# Patient Record
Sex: Female | Born: 2000 | Race: White | Hispanic: No | Marital: Married | Smoking: Never smoker
Health system: Southern US, Community
[De-identification: ages and names within clinical notes are randomized; demographics above are authoritative.]

## PROBLEM LIST (undated history)

## (undated) DIAGNOSIS — I4891 Unspecified atrial fibrillation: Secondary | ICD-10-CM

## (undated) DIAGNOSIS — I471 Supraventricular tachycardia, unspecified: Secondary | ICD-10-CM

## (undated) DIAGNOSIS — R51 Headache: Secondary | ICD-10-CM

## (undated) DIAGNOSIS — F419 Anxiety disorder, unspecified: Secondary | ICD-10-CM

## (undated) DIAGNOSIS — F32A Depression, unspecified: Secondary | ICD-10-CM

## (undated) DIAGNOSIS — G90A Postural orthostatic tachycardia syndrome (POTS): Secondary | ICD-10-CM

## (undated) DIAGNOSIS — F329 Major depressive disorder, single episode, unspecified: Secondary | ICD-10-CM

## (undated) DIAGNOSIS — K589 Irritable bowel syndrome without diarrhea: Secondary | ICD-10-CM

## (undated) HISTORY — PX: INCISION AND DRAINAGE: SHX5863

---
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
## 2020-07-22 HISTORY — PX: COLONOSCOPY WITH ESOPHAGOGASTRODUODENOSCOPY (EGD): SHX5779

## 2020-08-29 ENCOUNTER — Emergency Department (HOSPITAL_COMMUNITY)
Admission: EM | Admit: 2020-08-29 | Discharge: 2020-08-29 | Disposition: A | Discharge status: Home / Self Care | Payer: Medicaid Other | Attending: Emergency Medicine | Admitting: Emergency Medicine

## 2020-08-29 ENCOUNTER — Encounter (HOSPITAL_COMMUNITY): Payer: Self-pay | Admitting: *Deleted

## 2020-08-29 ENCOUNTER — Other Ambulatory Visit: Payer: Self-pay

## 2020-08-29 DIAGNOSIS — K0889 Other specified disorders of teeth and supporting structures: Secondary | ICD-10-CM | POA: Diagnosis not present

## 2020-08-29 DIAGNOSIS — U071 COVID-19: Secondary | ICD-10-CM | POA: Insufficient documentation

## 2020-08-29 DIAGNOSIS — Z7722 Contact with and (suspected) exposure to environmental tobacco smoke (acute) (chronic): Secondary | ICD-10-CM | POA: Diagnosis not present

## 2020-08-29 DIAGNOSIS — R197 Diarrhea, unspecified: Secondary | ICD-10-CM

## 2020-08-29 DIAGNOSIS — R112 Nausea with vomiting, unspecified: Secondary | ICD-10-CM | POA: Diagnosis present

## 2020-08-29 HISTORY — DX: Irritable bowel syndrome, unspecified: K58.9

## 2020-08-29 LAB — CBC WITH DIFFERENTIAL/PLATELET
Abs Immature Granulocytes: 0.04 10*3/uL (ref 0.00–0.07)
Basophils Absolute: 0.1 10*3/uL (ref 0.0–0.1)
Basophils Relative: 1 %
Eosinophils Absolute: 0 10*3/uL (ref 0.0–0.5)
Eosinophils Relative: 0 %
HCT: 44.7 % (ref 36.0–46.0)
Hemoglobin: 15.1 g/dL — ABNORMAL HIGH (ref 12.0–15.0)
Immature Granulocytes: 0 %
Lymphocytes Relative: 19 %
Lymphs Abs: 2.1 10*3/uL (ref 0.7–4.0)
MCH: 28.2 pg (ref 26.0–34.0)
MCHC: 33.8 g/dL (ref 30.0–36.0)
MCV: 83.4 fL (ref 80.0–100.0)
Monocytes Absolute: 0.4 10*3/uL (ref 0.1–1.0)
Monocytes Relative: 3 %
Neutro Abs: 8.5 10*3/uL — ABNORMAL HIGH (ref 1.7–7.7)
Neutrophils Relative %: 77 %
Platelets: 378 10*3/uL (ref 150–400)
RBC: 5.36 MIL/uL — ABNORMAL HIGH (ref 3.87–5.11)
RDW: 12.7 % (ref 11.5–15.5)
WBC: 11.1 10*3/uL — ABNORMAL HIGH (ref 4.0–10.5)
nRBC: 0 % (ref 0.0–0.2)

## 2020-08-29 LAB — BASIC METABOLIC PANEL
Anion gap: 10 (ref 5–15)
BUN: 17 mg/dL (ref 6–20)
CO2: 22 mmol/L (ref 22–32)
Calcium: 9.6 mg/dL (ref 8.9–10.3)
Chloride: 106 mmol/L (ref 98–111)
Creatinine, Ser: 0.8 mg/dL (ref 0.44–1.00)
GFR calc Af Amer: >60 mL/min (ref >60)
GFR calc non Af Amer: >60 mL/min (ref >60)
Glucose, Bld: 87 mg/dL (ref 70–99)
Potassium: 3.9 mmol/L (ref 3.5–5.1)
Sodium: 138 mmol/L (ref 135–145)

## 2020-08-29 MED ORDER — ACETAMINOPHEN 500 MG PO TABS
1000.0000 mg | ORAL_TABLET | Freq: Once | ORAL | Status: AC
  Administered 2020-08-29: 1000 mg via ORAL
  Filled 2020-08-29: qty 2

## 2020-08-29 MED ORDER — PENICILLIN V POTASSIUM 500 MG PO TABS: 500.0000 mg | ORAL_TABLET | Freq: Four times a day (QID) | ORAL | 0 refills | Status: AC

## 2020-08-29 MED ORDER — ONDANSETRON HCL 4 MG PO TABS: 4.0000 mg | ORAL_TABLET | Freq: Three times a day (TID) | ORAL | 0 refills | Status: DC | PRN

## 2020-08-29 MED ORDER — SODIUM CHLORIDE 0.9 % IV BOLUS
500.0000 mL | Freq: Once | INTRAVENOUS | Status: AC
  Administered 2020-08-29: 500 mL via INTRAVENOUS

## 2020-08-29 NOTE — ED Provider Notes (Signed)
[facility]-EMERGENCY DEPT Provider Note   CSN: [id number] Arrival date & time: 08/29/20  1048     History Chief Complaint  Patient presents with  . Covid Positive  . Emesis  . Dizziness    Dawn Mcpherson is a 19 y.o. female.  HPI   Patient is an 19 year old female with a medical history as noted below.  Patient states that about 8 days ago she began experiencing a multitude of symptoms including intermittent dry cough, fevers, chills, body aches, fatigue, intermittent diffuse headaches.  She states that she tested positive for COVID-19 the same day, which was August 31.  She initially was experiencing sore throat as well but states this alleviated.  Also notes associated congestion.  Yesterday she was increasingly nauseated and had about 8 episodes of vomiting.  Nonbilious nonbloody.  Mild lightheadedness without dizziness or syncope. Additionally reports watery brown diarrhea.  She has a history of IBS and denies her diarrhea has acutely worsened. No hematemesis or hematochezia.  At the same time she began to notice moderate pain in the left upper mouth.  Notes a history of dental caries in the region.  She went to her pediatrician earlier today who felt that she was dehydrated and warranted further evaluation in the emergency department.  No difficulty swallowing, drooling, chest pain, shortness of breath, abdominal pain, constipation, syncope.  She has not been vaccinated for COVID-19.     Past Medical History:  Diagnosis Date  . Anxiety   . Depression   . Headache(784.0)   . IBS (irritable bowel syndrome)     Patient Active Problem List   Diagnosis Date Noted  . MDD (major depressive disorder), single episode, severe , no psychosis (HCC) 09/20/2014  . Generalized anxiety disorder 09/20/2014    Past Surgical History:  Procedure Laterality Date  . INCISION AND DRAINAGE       OB History   No obstetric history on file.     No family history on  file.  Social History   Tobacco Use  . Smoking status: Passive Smoke Exposure - Never Smoker  . Smokeless tobacco: Never Used  Substance Use Topics  . Alcohol use: No  . Drug use: No    Home Medications Prior to Admission medications   Medication Sig Start Date End Date Taking? Authorizing Provider  amphetamine-dextroamphetamine (ADDERALL) 15 MG tablet Take 15 mg by mouth daily.    [provider]  ibuprofen (ADVIL,MOTRIN) 400 MG tablet Take 1 tablet (400 mg total) by mouth every 6 (six) hours as needed. 04/06/16   Triplett, Tammy, PA-C  ondansetron (ZOFRAN ODT) 4 MG disintegrating tablet Take 1 tablet (4 mg total) by mouth every 6 (six) hours as needed for nausea or vomiting. 08/30/17   Lowanda Foster, NP  QUEtiapine (SEROQUEL XR) 50 MG TB24 24 hr tablet Take 50 mg by mouth at bedtime.    [provider]    Allergies    Patient has no known allergies.  Review of Systems   Review of Systems  All other systems reviewed and are negative. Ten systems reviewed and are negative for acute change, except as noted in the HPI.   Physical Exam Updated Vital Signs BP (!) 118/94 (BP Location: Left Arm)   Pulse 96   Temp (!) 100.4 F (38 C) (Oral)   Resp 18   Ht 5\' 3"  (1.6 m)   Wt 94.3 kg   SpO2 96%   BMI 36.85 kg/m   Physical Exam  Vitals and nursing note reviewed.  Constitutional:      General: She is not in acute distress.    Appearance: Normal appearance. She is not ill-appearing, toxic-appearing or diaphoretic.  HENT:     Head: Normocephalic and atraumatic.     Right Ear: External ear normal.     Left Ear: External ear normal.     Nose: Nose normal.     Mouth/Throat:     Mouth: Mucous membranes are moist.     Pharynx: Oropharynx is clear. No oropharyngeal exudate or posterior oropharyngeal erythema.     Comments: Moderate TTP noted with manipulation of the left upper second molar.  No obvious dental caries.  No visible abscess or palpable fluctuance.   Generally normal-appearing dentition.  Uvula midline.  Patient readily handling secretions.  No evidence of PTA.  No hot potato voice. Eyes:     General: No scleral icterus.       Right eye: No discharge.        Left eye: No discharge.     Extraocular Movements: Extraocular movements intact.     Conjunctiva/sclera: Conjunctivae normal.  Cardiovascular:     Rate and Rhythm: Normal rate and regular rhythm.     Pulses: Normal pulses.     Heart sounds: Normal heart sounds. No murmur heard.  No friction rub. No gallop.      Comments: Heart is regular rate and rhythm.  Not tachycardic. Pulmonary:     Effort: Pulmonary effort is normal. No respiratory distress.     Breath sounds: Normal breath sounds. No stridor. No wheezing, rhonchi or rales.     Comments: Lungs are clear to auscultation bilaterally. Abdominal:     General: Abdomen is flat.     Palpations: Abdomen is soft.     Tenderness: There is no abdominal tenderness.  Musculoskeletal:        General: Normal range of motion.     Cervical back: Normal range of motion and neck supple. No tenderness.  Skin:    General: Skin is warm and dry.  Neurological:     General: No focal deficit present.     Mental Status: She is alert and oriented to person, place, and time.  Psychiatric:        Mood and Affect: Mood normal.        Behavior: Behavior normal.    ED Results / Procedures / Treatments   Labs (all labs ordered are listed, but only abnormal results are displayed) Labs Reviewed  CBC WITH DIFFERENTIAL/PLATELET - Abnormal; Notable for the following components:      Result Value   WBC 11.1 (*)    RBC 5.36 (*)    Hemoglobin 15.1 (*)    Neutro Abs 8.5 (*)    All other components within normal limits  BASIC METABOLIC PANEL   EKG None  Radiology No results found.  Procedures Procedures (including critical care time)  Medications Ordered in ED Medications  sodium chloride 0.9 % bolus 500 mL (0 mLs Intravenous Stopped  08/29/20 1239)  acetaminophen (TYLENOL) tablet 1,000 mg (1,000 mg Oral Given 08/29/20 1212)  sodium chloride 0.9 % bolus 500 mL (0 mLs Intravenous Stopped 08/29/20 1445)    ED Course  I have reviewed the triage vital signs and the nursing notes.  Pertinent labs & imaging results that were available during my care of the patient were reviewed by me and considered in my medical decision making (see chart for details).  Clinical Course as of  Aug 29 1424  Wed Aug 29, 2020  1149 Given ibuprofen PTA. Will give additional APAP.   Temp(!): 100.4 F (38 C) [LJ]  1235 Likely 2/2 hemoconcentration   Hemoglobin(!): 15.1 [LJ]  1236 Benign. No anion gap.   Basic metabolic panel [LJ]  1236 Mild leukocytosis and neutrophilia.   CBC with Differential(!) [LJ]  1252 Pt states she is still experiencing a mild HA. Labs concerning for hemoconcentration. Will give additional IVF.   [LJ]  1415 Pt reassessed and states she is feeling much better and notes moderate resolution of her HA.  Discussed patient being discharged at this time and she is amenable.  We will plan on providing a prescription for penicillin for her to take.  ECG is reassuring.  We will also provide a short prescription for Zofran for breakthrough nausea.  We discussed patient properly staying hydrated.  She understands that if her symptoms worsen or if she begins to once again develop intractable nausea and vomiting, she needs to return to the emergency department for reevaluation.  Her questions were answered and she was amicable at the time of discharge.  Her vital signs are stable.  She is not hypoxic.  She is not tachycardic.  Normotensive.   [LJ]    Clinical Course User Index [LJ] Placido SouJoldersma, Eleanna Theilen, PA-C   MDM Rules/Calculators/A&P                          Pt is a 19 y.o. female that presents with a history, physical exam, and ED Clinical Course as noted above.   Patient presents today with a multitude of symptoms consistent with COVID-19  as well as new onset dental pain.  I obtain basic labs on the patient.  Very mild leukocytosis and neutrophilia.  Patient had a mild fever of 100.4 Fahrenheit upon arrival.  She was given APAP in addition to the ibuprofen she was given just prior to arrival by her pediatrician.  She is normotensive and has not been hypoxic throughout her stay today.  Not tachypneic.  Patient noted having a significant headache when she arrived.  After APAP/ibuprofen as well as 1 L of IV fluids she notes moderate resolution of her symptoms.  I obtained an ECG as well which is reassuring.  Will discharge patient on a short course of Zofran.  We will additionally discharge patient on a course of penicillin for her dental pain.  She understands she needs to return to the emergency department if her symptoms worsen once again or if she develops intractable nausea and vomiting.  Patient is hemodynamically stable and in NAD at the time of d/c. Evaluation does not show pathology that would require ongoing emergent intervention or inpatient treatment. I explained the diagnosis to the patient. Patient is comfortable with above plan and is stable for discharge at this time. All questions were answered prior to disposition. Strict return precautions for returning to the ED were discussed. Encouraged follow up with PCP.    An After Visit Summary was printed and given to the patient.  Patient discharged to home/self care.  Condition at discharge: Stable  Note: Portions of this report may have been transcribed using voice recognition software. Every effort was made to ensure accuracy; however, inadvertent computerized transcription errors may be present.   Final Clinical Impression(s) / ED Diagnoses Final diagnoses:  Nausea vomiting and diarrhea  COVID-19  Pain, dental   Rx / DC Orders ED Discharge Orders  Ordered    penicillin v potassium (VEETID) 500 MG tablet  4 times daily        08/29/20 1421            Placido Sou, PA-C 08/29/20 1515    Alvira Monday, MD 08/29/20 1642

## 2020-08-29 NOTE — ED Triage Notes (Addendum)
Tested + for Covid Aug 31. Vomiting started yesterday, also Pediatrician wants someone to look at her tooth, top back left.  Pt later adds she has anxiety and is dizzy  Fever this morning 102.2 at [facility] office Tylenol given

## 2020-08-29 NOTE — Discharge Instructions (Signed)
I have prescribed penicillin for your tooth pain.  You are going to take this 4 times a day for the next 7 days.  Please do not stop taking this early.  I have also prescribed you Zofran for worsening nausea.  Only take this if you feel that you cannot eat/drink due to nausea and vomiting.  If your symptoms worsen once again you need to return to the emergency department for reevaluation.  It was a pleasure to meet you.

## 2021-03-06 ENCOUNTER — Other Ambulatory Visit: Payer: Self-pay

## 2021-03-06 ENCOUNTER — Ambulatory Visit: Payer: Medicaid Other | Admitting: Internal Medicine

## 2021-03-06 ENCOUNTER — Ambulatory Visit (INDEPENDENT_AMBULATORY_CARE_PROVIDER_SITE_OTHER): Payer: PRIVATE HEALTH INSURANCE | Admitting: Nurse Practitioner

## 2021-03-06 DIAGNOSIS — F322 Major depressive disorder, single episode, severe without psychotic features: Secondary | ICD-10-CM | POA: Diagnosis not present

## 2021-03-06 DIAGNOSIS — F909 Attention-deficit hyperactivity disorder, unspecified type: Secondary | ICD-10-CM | POA: Diagnosis not present

## 2021-03-06 DIAGNOSIS — F411 Generalized anxiety disorder: Secondary | ICD-10-CM | POA: Diagnosis not present

## 2021-03-06 DIAGNOSIS — R635 Abnormal weight gain: Secondary | ICD-10-CM

## 2021-03-06 DIAGNOSIS — Z7689 Persons encountering health services in other specified circumstances: Secondary | ICD-10-CM | POA: Diagnosis not present

## 2021-03-06 DIAGNOSIS — L732 Hidradenitis suppurativa: Secondary | ICD-10-CM | POA: Insufficient documentation

## 2021-03-06 MED ORDER — BUPROPION HCL ER (XL) 150 MG PO TB24: 150.0000 mg | ORAL_TABLET | Freq: Every day | ORAL | 0 refills | Status: DC

## 2021-03-06 MED ORDER — DOXYCYCLINE HYCLATE 100 MG PO TABS: 100.0000 mg | ORAL_TABLET | Freq: Two times a day (BID) | ORAL | 0 refills | Status: DC

## 2021-03-06 NOTE — Assessment & Plan Note (Signed)
-  will check thyroid panel with next set of labs 

## 2021-03-06 NOTE — Assessment & Plan Note (Signed)
-  obtain records 

## 2021-03-06 NOTE — Assessment & Plan Note (Signed)
-  Rx. Doxycycline -discussed warm compressed -if no improvement with doxy, will consider derm or surgeon referral -she has had I&D in the past and seen derm as well

## 2021-03-06 NOTE — Patient Instructions (Addendum)
Please have fasting labs drawn 2-3 days prior to your appointment so we can discuss them at your next visit.  Notify me/the office immediately if you have any suicidal ideation after starting the wellbutrin.  I sent in a referral for psychiatry and therapy, so you should hear form them within the week. If you haven;t heard from them by the time your antibiotic (doxycycline) has finished, please notify the office so we can check on the status of the referral for you.

## 2021-03-06 NOTE — Assessment & Plan Note (Signed)
-  she would like medication to help with her concentration -referral to psychiatry

## 2021-03-06 NOTE — Assessment & Plan Note (Signed)
-  hx of MDD -has had worsening depression and anxiety lately -suicide attempt in the past, self-mutilated in the past -no SI, HI, or psychosis today -referral to psychiatry -referral to therapy

## 2021-03-06 NOTE — Progress Notes (Signed)
New Patient Office Visit  Subjective:  Patient ID: Dawn Mcpherson, female    DOB: 2001-07-10  Age: 20 y.o. MRN: [id number]  CC:  Chief Complaint  Patient presents with  . New Patient (Initial Visit)    HPI Dawn Mcpherson presents for new patient visit. Transferring care from [place] physical was about a year ago. Last labs 08/29/20.  She was taking adderrall around age 60, but states she didn't take it for long.  She states that she has been gaining weight lately.  She states has been having headaches for several years, and she gets them about once per day and they may last a few minutes to several hours. She states she has nausea with her headaches.  Past Medical History:  Diagnosis Date  . Anxiety   . Depression   . Depression    [employer]rees[employer] 03/06/2021  . Headache(784.0)   . IBS (irritable bowel syndrome)     Past Surgical History:  Procedure Laterality Date  . INCISION AND DRAINAGE      History reviewed. No pertinent family history.  Social History   Socioeconomic History  . Marital status: Single    Spouse name: Not on file  . Number of children: Not on file  . Years of education: Not on file  . Highest education level: Not on file  Occupational History  . Occupation: [employer]    Comment: Office manager  Tobacco Use  . Smoking status: Passive Smoke Exposure - Never Smoker  . Smokeless tobacco: Never Used  Vaping Use  . Vaping Use: Never used  Substance and Sexual Activity  . Alcohol use: No  . Drug use: No  . Sexual activity: Never  Other Topics Concern  . Not on file  Social History Narrative  . Not on file   Social Determinants of Health   Financial Resource Strain: Not on file  Food Insecurity: Not on file  Transportation Needs: Not on file  Physical Activity: Not on file  Stress: Not on file  Social Connections: Not on file  Intimate Partner Violence: Not on file    ROS Review of Systems  Constitutional:  Negative.   Respiratory: Negative.   Cardiovascular: Negative.   Skin: Positive for wound.       To right axilla  Psychiatric/Behavioral: Negative for self-injury and suicidal ideas. The patient is nervous/anxious and is hyperactive.        Has hx of SI and was admitted to [facility] for suicidal ideation in the past (2015-2016ish)    Objective:   Today's Vitals: BP 120/82   Pulse 97   Temp 98.2 F (36.8 C)   Resp 20   Ht 5' 3"  (1.6 m)   Wt 211 lb (95.7 kg)   SpO2 99%   BMI 37.38 kg/m   Physical Exam Constitutional:      Appearance: She is obese.  Cardiovascular:     Rate and Rhythm: Normal rate and regular rhythm.     Pulses: Normal pulses.     Heart sounds: Normal heart sounds.  Pulmonary:     Effort: Pulmonary effort is normal.     Breath sounds: Normal breath sounds.  Skin:    Comments: Wound to right axilla, scarring present from previous HS episodes  Neurological:     Mental Status: She is alert.  Psychiatric:        Behavior: Behavior normal.        Thought Content: Thought content normal.  Judgment: Judgment normal.     Comments: Anxious affect     Assessment & Plan:   Problem List Items Addressed This Visit      Musculoskeletal and Integument   Hidradenitis suppurativa of right axilla    -Rx. Doxycycline -discussed warm compressed -if no improvement with doxy, will consider derm or surgeon referral -she has had I&D in the past and seen derm as well        Other   Generalized anxiety disorder (Chronic)    -no current medications -GAD-7 = 21/21 today -Rx. Wellbutrin; will consider dose increase in a month as long as she has no adverse effects or SI -referral to psychiatrist and therapy      Relevant Medications   buPROPion (WELLBUTRIN XL) 150 MG 24 hr tablet   Other Relevant Orders   Ambulatory referral to Psychiatry   MDD (major depressive disorder), single episode, severe , no psychosis ([place])    -hx of MDD -has had worsening depression and  anxiety lately -suicide attempt in the past, self-mutilated in the past -no SI, HI, or psychosis today -referral to psychiatry -referral to therapy      Relevant Medications   buPROPion (WELLBUTRIN XL) 150 MG 24 hr tablet   Other Relevant Orders   Ambulatory referral to Psychiatry   Encounter to establish care    -obtain records      Relevant Orders   CBC with Differential/Platelet   CMP14+EGFR   Lipid Panel With LDL/HDL Ratio   TSH + free T4   ADHD    -she would like medication to help with her concentration -referral to psychiatry      Relevant Orders   Ambulatory referral to Psychiatry   Weight gain, abnormal    -will check thyroid panel with next set of labs         Outpatient Encounter Medications as of 03/06/2021  Medication Sig  . buPROPion (WELLBUTRIN XL) 150 MG 24 hr tablet Take 1 tablet (150 mg total) by mouth daily.  Marland Kitchen doxycycline (VIBRA-TABS) 100 MG tablet Take 1 tablet (100 mg total) by mouth 2 (two) times daily.  . [DISCONTINUED] amphetamine-dextroamphetamine (ADDERALL) 15 MG tablet Take 15 mg by mouth daily. (Patient not taking: Reported on 03/06/2021)  . [DISCONTINUED] ibuprofen (ADVIL,MOTRIN) 400 MG tablet Take 1 tablet (400 mg total) by mouth every 6 (six) hours as needed. (Patient not taking: Reported on 03/06/2021)  . [DISCONTINUED] ondansetron (ZOFRAN ODT) 4 MG disintegrating tablet Take 1 tablet (4 mg total) by mouth every 6 (six) hours as needed for nausea or vomiting. (Patient not taking: Reported on 03/06/2021)  . [DISCONTINUED] ondansetron (ZOFRAN) 4 MG tablet Take 1 tablet (4 mg total) by mouth every 8 (eight) hours as needed for nausea or vomiting. (Patient not taking: Reported on 03/06/2021)  . [DISCONTINUED] QUEtiapine (SEROQUEL XR) 50 MG TB24 24 hr tablet Take 50 mg by mouth at bedtime. (Patient not taking: Reported on 03/06/2021)   No facility-administered encounter medications on file as of 03/06/2021.    Follow-up: Return in about 1 month  (around 04/06/2021) for Physical Exam.   Noreene Larsson, NP

## 2021-03-06 NOTE — Assessment & Plan Note (Addendum)
-  no current medications -GAD-7 = 21/21 today -Rx. Wellbutrin; will consider dose increase in a month as long as she has no adverse effects or SI -referral to psychiatrist and therapy

## 2021-03-08 ENCOUNTER — Other Ambulatory Visit: Payer: Self-pay

## 2021-03-08 DIAGNOSIS — F411 Generalized anxiety disorder: Secondary | ICD-10-CM

## 2021-03-24 ENCOUNTER — Telehealth: Payer: PRIVATE HEALTH INSURANCE | Admitting: Physician Assistant

## 2021-03-24 ENCOUNTER — Encounter: Payer: Self-pay | Admitting: Physician Assistant

## 2021-03-24 DIAGNOSIS — J029 Acute pharyngitis, unspecified: Secondary | ICD-10-CM

## 2021-03-24 DIAGNOSIS — R112 Nausea with vomiting, unspecified: Secondary | ICD-10-CM

## 2021-03-24 DIAGNOSIS — J069 Acute upper respiratory infection, unspecified: Secondary | ICD-10-CM | POA: Diagnosis not present

## 2021-03-24 MED ORDER — ONDANSETRON 4 MG PO TBDP: ORAL_TABLET | ORAL | 0 refills | Status: DC

## 2021-03-24 MED ORDER — AMOXICILLIN-POT CLAVULANATE 875-125 MG PO TABS: 1.0000 | ORAL_TABLET | Freq: Two times a day (BID) | ORAL | 0 refills | Status: DC

## 2021-03-24 MED ORDER — FLUTICASONE PROPIONATE 50 MCG/ACT NA SUSP: 2.0000 | Freq: Every day | NASAL | 0 refills | Status: DC

## 2021-03-24 NOTE — Progress Notes (Signed)
Dawn Mcpherson, Dawn Mcpherson are scheduled for a virtual visit with your provider today.    Just as we do with appointments in the office, we must obtain your consent to participate.  Your consent will be active for this visit and any virtual visit you may have with one of our providers in the next 365 days.    If you have a MyChart account, I can also send a copy of this consent to you electronically.  All virtual visits are billed to your insurance company just like a traditional visit in the office.  As this is a virtual visit, video technology does not allow for your provider to perform a traditional examination.  This may limit your provider's ability to fully assess your condition.  If your provider identifies any concerns that need to be evaluated in person or the need to arrange testing such as labs, EKG, etc, we will make arrangements to do so.    Although advances in technology are sophisticated, we cannot ensure that it will always work on either your end or our end.  If the connection with a video visit is poor, we may have to switch to a telephone visit.  With either a video or telephone visit, we are not always able to ensure that we have a secure connection.   I need to obtain your verbal consent now.   Are you willing to proceed with your visit today?   [name] has provided verbal consent on 03/24/2021 for a virtual visit (video or telephone).   Dierdre Forth, PA-C 03/24/2021  3:53 PM   Date:  03/24/2021   ID:  [name], DOB [birth year], MRN [id number]  Patient Location: Home Provider Location: Home Office   Participants: Patient and Provider for Visit and Wrap up  Method of visit: Video  Location of Patient: Home Location of Provider: Home Office Consent was obtain for visit over the video. Services rendered by provider: Visit was performed via video  A video enabled telemedicine application was used and I verified that I am speaking with the correct person using two  identifiers.  PCP:  Heather Roberts, NP   Chief Complaint:  Sore throat  History of Present Illness:    Dawn Mcpherson is a 20 y.o. female with history as stated below. Presents video telehealth for an acute care visit  Onset of symptoms was yesterday and symptoms have been persistent and include: fever to 102, sore throat, cough, rhinorrhea, nasal congestion, nausea, vomiting x2 (NBNB). Pt reports her co-worker is sick with similar symptoms. Pt reports symptoms are similar to previous episodes of strep throat.  Pt is not vaccinated for COVID, did have COVID in October 2020.    Denies having shortness of breath, chest pain, diarrhea, urinary symptoms, vaginal symptoms.  Modifying factors include: Tylenol and motrin which helped with the fever  No other aggravating or relieving factors.  No other c/o.  Pt denies possibility of pregnancy.  The patient does have symptoms concerning for COVID-19 infection (fever, chills, cough, or new shortness of breath).  Patient has not been tested for COVID during this illness.  Past Medical, Surgical, Social History, Allergies, and Medications have been Reviewed.  Patient Active Problem List   Diagnosis Date Noted  . Encounter to establish care 03/06/2021  . ADHD 03/06/2021  . Weight gain, abnormal 03/06/2021  . Hidradenitis suppurativa of right axilla 03/06/2021  . MDD (major depressive disorder), single episode, severe , no psychosis (HCC) 09/20/2014  .  Generalized anxiety disorder 09/20/2014    Social History   Tobacco Use  . Smoking status: Passive Smoke Exposure - Never Smoker  . Smokeless tobacco: Never Used  Substance Use Topics  . Alcohol use: No     Current Outpatient Medications:  .  amoxicillin-clavulanate (AUGMENTIN) 875-125 MG tablet, Take 1 tablet by mouth 2 (two) times daily. One po bid x 7 days, Disp: 14 tablet, Rfl: 0 .  fluticasone (FLONASE) 50 MCG/ACT nasal spray, Place 2 sprays into both nostrils daily., Disp: 9.9  g, Rfl: 0 .  ondansetron (ZOFRAN ODT) 4 MG disintegrating tablet, 4mg  ODT q4 hours prn nausea/vomit, Disp: 10 tablet, Rfl: 0 .  buPROPion (WELLBUTRIN XL) 150 MG 24 hr tablet, Take 1 tablet (150 mg total) by mouth daily., Disp: 30 tablet, Rfl: 0   No Known Allergies   Review of Systems  Constitutional: Positive for chills, fever and malaise/fatigue.  HENT: Positive for congestion, ear pain and sore throat.   Eyes: Negative for blurred vision and double vision.  Respiratory: Positive for cough. Negative for shortness of breath and wheezing.   Cardiovascular: Negative for chest pain, palpitations and leg swelling.  Gastrointestinal: Positive for nausea and vomiting. Negative for abdominal pain and diarrhea.  Genitourinary: Negative for dysuria.  Musculoskeletal: Negative for myalgias.  Skin: Negative for rash.  Neurological: Negative for loss of consciousness, weakness and headaches.  Psychiatric/Behavioral: The patient is not nervous/anxious.    See HPI for history of present illness.  Physical Exam Constitutional:      General: She is not in acute distress.    Appearance: Normal appearance. She is not ill-appearing.  HENT:     Head: Normocephalic and atraumatic.     Nose: Congestion and rhinorrhea present.     Mouth/Throat:     Mouth: Mucous membranes are moist.     Comments: Posterior oropharynx is red without exudate Eyes:     Extraocular Movements: Extraocular movements intact.  Pulmonary:     Effort: Pulmonary effort is normal.     Comments: Speaking in full sentences Dry cough Musculoskeletal:        General: Normal range of motion.     Cervical back: Normal range of motion.  Skin:    Coloration: Skin is not pale.  Neurological:     General: No focal deficit present.     Mental Status: She is alert. Mental status is at baseline.  Psychiatric:        Mood and Affect: Mood normal.               A&P  1. Upper respiratory tract infection, unspecified type  -  flonase  - OTC cough medication as needed - Robitussin or Delsym  - fever control with tylenol and ibuprofen  - symptoms concerning for URI vs COVID  2. Sore throat  - likely viral given other URI symptoms  - pt reports symptoms similar to previous episode of strep - will give Augmentin  3. Non-intractable vomiting with nausea, unspecified vomiting type  - 2 episodes of emesis, holding fluids at this time  - zofran as needed for nausea and vomiting   Patient voiced understanding and agreement to plan.   Time:   Today, I have spent 15 minutes with the patient with telehealth technology discussing the above problems, reviewing the chart, previous notes, medications and orders.    Tests Ordered: No orders of the defined types were placed in this encounter.   Medication Changes: Meds ordered this encounter  Medications  . amoxicillin-clavulanate (AUGMENTIN) 875-125 MG tablet    Sig: Take 1 tablet by mouth 2 (two) times daily. One po bid x 7 days    Dispense:  14 tablet    Refill:  0  . ondansetron (ZOFRAN ODT) 4 MG disintegrating tablet    Sig: 4mg  ODT q4 hours prn nausea/vomit    Dispense:  10 tablet    Refill:  0  . fluticasone (FLONASE) 50 MCG/ACT nasal spray    Sig: Place 2 sprays into both nostrils daily.    Dispense:  9.9 g    Refill:  0     Disposition:  Follow up PCP or urgent care if no improvement in 24-48 hours.  11-09-1980, PA-C  03/24/2021 4:09 PM

## 2021-03-24 NOTE — Patient Instructions (Addendum)
1. Upper respiratory tract infection, unspecified type  - flonase  - OTC cough medication as needed - Robitussin or Delsym  - fever control with tylenol and ibuprofen  - symptoms concerning for URI vs COVID  2. Sore throat  - likely viral given other URI symptoms  - pt reports symptoms similar to previous episode of strep - will give Augmentin  3. Non-intractable vomiting with nausea, unspecified vomiting type  - 2 episodes of emesis, holding fluids at this time  - zofran as needed for nausea and vomiting Nausea and Vomiting, Adult Nausea is feeling sick to your stomach or feeling that you are about to throw up (vomit). Vomiting is when food in your stomach is thrown up and out of the mouth. Throwing up can make you feel weak. It can also make you lose too much water in your body (get dehydrated). If you lose too much water in your body, you may:  Feel tired.  Feel thirsty.  Have a dry mouth.  Have cracked lips.  Go pee (urinate) less often. Older adults and people with other diseases or a weak body defense system (immune system) are at higher risk for losing too much water in the body. If you feel sick to your stomach and you throw up, it is important to follow instructions from your doctor about how to take care of yourself. Follow these instructions at home: Watch your symptoms for any changes. Tell your doctor about them. Follow these instructions to care for yourself at home. Eating and drinking  Take an ORS (oral rehydration solution). This is a drink that is sold at pharmacies and stores.  Drink clear fluids in small amounts as you are able, such as: ? Water. ? Ice chips. ? Fruit juice that has water added (diluted fruit juice). ? Low-calorie sports drinks.  Eat bland, easy-to-digest foods in small amounts as you are able, such as: ? Bananas. ? Applesauce. ? Rice. ? Low-fat (lean) meats. ? Toast. ? Crackers.  Avoid drinking fluids that have a lot of sugar or  caffeine in them. This includes energy drinks, sports drinks, and soda.  Avoid alcohol.  Avoid spicy or fatty foods.      General instructions  Take over-the-counter and prescription medicines only as told by your doctor.  Drink enough fluid to keep your pee (urine) pale yellow.  Wash your hands often with soap and water. If you cannot use soap and water, use hand sanitizer.  Make sure that all people in your home wash their hands well and often.  Rest at home while you get better.  Watch your condition for any changes.  Take slow and deep breaths when you feel sick to your stomach.  Keep all follow-up visits as told by your doctor. This is important. Contact a doctor if:  Your symptoms get worse.  You have new symptoms.  You have a fever.  You cannot drink fluids without throwing up.  You feel sick to your stomach for more than 2 days.  You feel light-headed or dizzy.  You have a headache.  You have muscle cramps.  You have a rash.  You have pain while peeing. Get help right away if:  You have pain in your chest, neck, arm, or jaw.  You feel very weak or you pass out (faint).  You throw up again and again.  You have throw up that is bright red or looks like black coffee grounds.  You have bloody or black poop (  stools) or poop that looks like tar.  You have a very bad headache, a stiff neck, or both.  You have very bad pain, cramping, or bloating in your belly (abdomen).  You have trouble breathing.  You are breathing very quickly.  Your heart is beating very quickly.  Your skin feels cold and clammy.  You feel confused.  You have signs of losing too much water in your body, such as: ? Dark pee, very little pee, or no pee. ? Cracked lips. ? Dry mouth. ? Sunken eyes. ? Sleepiness. ? Weakness. These symptoms may be an emergency. Do not wait to see if the symptoms will go away. Get medical help right away. Call your local emergency services  (911 in the [place] Do not drive yourself to the hospital. Summary  Nausea is feeling sick to your stomach or feeling that you are about to throw up (vomit). Vomiting is when food in your stomach is thrown up and out of the mouth.  Follow instructions from your doctor about eating and drinking to keep from losing too much water in your body.  Take over-the-counter and prescription medicines only as told by your doctor.  Contact your doctor if your symptoms get worse or you have new symptoms.  Keep all follow-up visits as told by your doctor. This is important. This information is not intended to replace advice given to you by your health care provider. Make sure you discuss any questions you have with your health care provider. Document Revised: 04/01/2019 Document Reviewed: 05/18/2018 Elsevier Patient Education  2021 [employer] Inc.  Pharyngitis  Pharyngitis is a sore throat (pharynx). This is when there is redness, pain, and swelling in your throat. Most of the time, this condition gets better on its own. In some cases, you may need medicine. Follow these instructions at home:  Take over-the-counter and prescription medicines only as told by your doctor. ? If you were prescribed an antibiotic medicine, take it as told by your doctor. Do not stop taking the antibiotic even if you start to feel better. ? Do not give children aspirin. Aspirin has been linked to Reye syndrome.  Drink enough water and fluids to keep your pee (urine) clear or pale yellow.  Get a lot of rest.  Rinse your mouth (gargle) with a salt-water mixture 3-4 times a day or as needed. To make a salt-water mixture, completely dissolve -1 tsp of salt in 1 cup of warm water.  If your doctor approves, you may use throat lozenges or sprays to soothe your throat. Contact a doctor if:  You have large, tender lumps in your neck.  You have a rash.  You cough up green, yellow-brown, or bloody spit. Get help right away  if:  You have a stiff neck.  You drool or cannot swallow liquids.  You cannot drink or take medicines without throwing up.  You have very bad pain that does not go away with medicine.  You have problems breathing, and it is not from a stuffy nose.  You have new pain and swelling in your knees, ankles, wrists, or elbows. Summary  Pharyngitis is a sore throat (pharynx). This is when there is redness, pain, and swelling in your throat.  If you were prescribed an antibiotic medicine, take it as told by your doctor. Do not stop taking the antibiotic even if you start to feel better.  Most of the time, pharyngitis gets better on its own. Sometimes, you may need medicine. This information  is not intended to replace advice given to you by your health care provider. Make sure you discuss any questions you have with your health care provider. Document Revised: 11/20/2017 Document Reviewed: 01/13/2017 Elsevier Patient Education  2021 Elsevier Inc.  Viral Respiratory Infection A viral respiratory infection is an illness that affects parts of the body that are used for breathing. These include the lungs, nose, and throat. It is caused by a germ called a virus. Some examples of this kind of infection are:  A cold.  The flu (influenza).  A respiratory syncytial virus (RSV) infection. A person who gets this illness may have the following symptoms:  A stuffy or runny nose.  Yellow or green fluid in the nose.  A cough.  Sneezing.  Tiredness (fatigue).  Achy muscles.  A sore throat.  Sweating or chills.  A fever.  A headache. Follow these instructions at home: Managing pain and congestion  Take over-the-counter and prescription medicines only as told by your doctor.  If you have a sore throat, gargle with salt water. Do this 3-4 times per day or as needed. To make a salt-water mixture, dissolve -1 tsp of salt in 1 cup of warm water. Make sure that all the salt  dissolves.  Use nose drops made from salt water. This helps with stuffiness (congestion). It also helps soften the skin around your nose.  Drink enough fluid to keep your pee (urine) pale yellow. General instructions  Rest as much as possible.  Do not drink alcohol.  Do not use any products that have nicotine or tobacco, such as cigarettes and e-cigarettes. If you need help quitting, ask your doctor.  Keep all follow-up visits as told by your doctor. This is important.   How is this prevented?  Get a flu shot every year. Ask your doctor when you should get your flu shot.  Do not let other people get your germs. If you are sick: ? Stay home from work or school. ? Wash your hands with soap and water often. Wash your hands after you cough or sneeze. If soap and water are not available, use hand sanitizer.  Avoid contact with people who are sick during cold and flu season. This is in fall and winter.   Get help if:  Your symptoms last for 10 days or longer.  Your symptoms get worse over time.  You have a fever.  You have very bad pain in your face or forehead.  Parts of your jaw or neck become very swollen. Get help right away if:  You feel pain or pressure in your chest.  You have shortness of breath.  You faint or feel like you will faint.  You keep throwing up (vomiting).  You feel confused. Summary  A viral respiratory infection is an illness that affects parts of the body that are used for breathing.  Examples of this illness include a cold, the flu, and respiratory syncytial virus (RSV) infection.  The infection can cause a runny nose, cough, sneezing, sore throat, and fever.  Follow what your doctor tells you about taking medicines, drinking lots of fluid, washing your hands, resting at home, and avoiding people who are sick. This information is not intended to replace advice given to you by your health care provider. Make sure you discuss any questions you  have with your health care provider. Document Revised: 12/16/2018 Document Reviewed: 01/18/2018 Elsevier Patient Education  2021 [employer].

## 2021-03-29 ENCOUNTER — Encounter: Payer: PRIVATE HEALTH INSURANCE | Admitting: Nurse Practitioner

## 2021-04-04 ENCOUNTER — Ambulatory Visit: Payer: PRIVATE HEALTH INSURANCE | Admitting: Psychology

## 2021-04-08 ENCOUNTER — Other Ambulatory Visit: Payer: Self-pay | Admitting: Nurse Practitioner

## 2021-04-09 MED ORDER — BUPROPION HCL ER (XL) 150 MG PO TB24
150.0000 mg | ORAL_TABLET | Freq: Every day | ORAL | 0 refills | Status: DC
Start: 2021-04-09 — End: 2021-05-31

## 2021-04-19 ENCOUNTER — Encounter: Payer: PRIVATE HEALTH INSURANCE | Admitting: Nurse Practitioner

## 2021-04-23 ENCOUNTER — Ambulatory Visit: Payer: PRIVATE HEALTH INSURANCE

## 2021-04-23 ENCOUNTER — Telehealth (INDEPENDENT_AMBULATORY_CARE_PROVIDER_SITE_OTHER): Payer: PRIVATE HEALTH INSURANCE | Admitting: Licensed Clinical Social Worker

## 2021-04-23 ENCOUNTER — Other Ambulatory Visit: Payer: Self-pay

## 2021-04-23 DIAGNOSIS — F411 Generalized anxiety disorder: Secondary | ICD-10-CM

## 2021-04-23 DIAGNOSIS — F322 Major depressive disorder, single episode, severe without psychotic features: Secondary | ICD-10-CM

## 2021-04-23 NOTE — BH Specialist Note (Signed)
[facility] Initial Clinical Assessment  MRN: [id number] NAME: Dawn Mcpherson Date: 04/23/21  Start time:  1130a End time:  12p Total time:  30 min  Call number:  [phone]  Type of Contact:  telephone Patient consent obtained:  yes Reason for Visit today:  begin [facility] services  Treatment History Patient recently received Inpatient Treatment:  no  Facility/Program:    Date of discharge:   Patient currently being seen by therapist/psychiatrist:   Patient currently receiving the following services:    Past Psychiatric History/Hospitalization(s): Anxiety: Yes Bipolar Disorder: No Depression: Yes Mania: No Psychosis: No Schizophrenia: No Personality Disorder: No Hospitalization for psychiatric illness: No History of Electroconvulsive Shock Therapy: No Prior Suicide Attempts: No  Clinical Assessment:  PHQ-9 Assessments: Depression screen [facility] 2/9 04/23/2021 03/06/2021  Decreased Interest 2 0  Down, Depressed, Hopeless 2 0  PHQ - 2 Score 4 0  Altered sleeping 2 -  Tired, decreased energy 3 -  Change in appetite 3 -  Feeling bad or failure about yourself  2 -  Trouble concentrating 2 -  Moving slowly or fidgety/restless 2 -  Suicidal thoughts 0 -  PHQ-9 Score 18 -  Difficult doing work/chores Very difficult -    GAD-7 Assessments: GAD 7 : Generalized Anxiety Score 04/23/2021 03/06/2021  Nervous, Anxious, on Edge 3 3  Control/stop worrying 3 3  Worry too much - different things 3 3  Trouble relaxing 3 3  Restless 3 3  Easily annoyed or irritable 3 3  Afraid - awful might happen 3 3  Total GAD 7 Score 21 21  Anxiety Difficulty Somewhat difficult Very difficult     Social Functioning Social maturity:  WNL Social judgement:  WNL  Stress Current stressors:  grandfather in hospital; dog is sick Familial stressors:  father is disabled Sleep:  poor sleep pattersn Appetite:  only eats once; will vomit after eating Coping ability:  overwhelmed Patient  taking medications as prescribed:    Current medications:  Outpatient Encounter Medications as of 04/23/2021  Medication Sig  . amoxicillin-clavulanate (AUGMENTIN) 875-125 MG tablet Take 1 tablet by mouth 2 (two) times daily. One po bid x 7 days  . buPROPion (WELLBUTRIN XL) 150 MG 24 hr tablet Take 1 tablet (150 mg total) by mouth daily.  . fluticasone (FLONASE) 50 MCG/ACT nasal spray Place 2 sprays into both nostrils daily.  . ondansetron (ZOFRAN ODT) 4 MG disintegrating tablet 4mg  ODT q4 hours prn nausea/vomit   No facility-administered encounter medications on file as of 04/23/2021.    Self-harm Behaviors Risk Assessment Self-harm risk factors:   Patient endorses recent thoughts of harming self:    06/23/2021 Suicide Severity Rating Scale: No flowsheet data found.  Danger to Others Risk Assessment Danger to others risk factors:   Patient endorses recent thoughts of harming others:    Dynamic Appraisal of Situational Aggression (DASA): No flowsheet data found.  Substance Use Assessment Patient recently consumed alcohol:    Alcohol Use Disorder Identification Test (AUDIT):  Alcohol Use Disorder Test (AUDIT) 09/20/2014  1. How often do you have a drink containing alcohol? 0   Patient recently used drugs:    Opioid Risk Assessment:  Patient is concerned about dependence or abuse of substances:    ASAM Multidimensional Assessment Summary:  Dimension 1:    Dimension 1 Rating:    Dimension 2:    Dimension 2 Rating:    Dimension 3:    Dimension 3 Rating:    Dimension 4:  Dimension 4 Rating:    Dimension 5:    Dimension 5 Rating:    Dimension 6:    Dimension 6 Rating:   ASAM's Severity Rating Score:   ASAM Recommended Level of Treatment:     Goals, Interventions and Follow-up Plan Goals: Increase healthy adjustment to current life circumstances Interventions: Motivational Interviewing, Solution-Focused Strategies and Mindfulness or Relaxation Training Follow-up Plan: Weekly  [facility] services  Summary of Clinical Assessment Summary: Dawn Mcpherson is a 20 yr old woman who was referred by her PCP for depression and anxiety.  Patient has a history of depression and anxiety. She attended therapy for 1.5 yrs and received medication while in high school.  She discontinued medications "I was feeling and doing better, so I thought I had a handle on it." She reports poor eating; getting nauseous after eating; eats dinner only. She also reports poor sleep; having difficulty falling asleep.  She has a boyfriend for the past 5 years; reports good relationship with him and his family.  She has friends that she engages with weekly. Arita has 2 sisters that she has a good relationship with. Lives at home with parents; poor relationship with father but good connection with mother. Mother encourages therapy. Denies drugs, alcohol usage.  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  She was tearful throughout the interview. Will follow up; may consider for traditional therapy  Marinda Elk, LCSW

## 2021-04-24 ENCOUNTER — Telehealth: Payer: Self-pay | Admitting: Licensed Clinical Social Worker

## 2021-04-24 DIAGNOSIS — F322 Major depressive disorder, single episode, severe without psychotic features: Secondary | ICD-10-CM

## 2021-04-24 DIAGNOSIS — F411 Generalized anxiety disorder: Secondary | ICD-10-CM

## 2021-04-24 NOTE — BH Specialist Note (Signed)
Virtual Behavioral Health Treatment Plan Team Note  MRN: [id number] NAME: Dawn Mcpherson  DATE: 04/25/21  Start time:  420p End time:  430p Total time:  10 min  Total number of Virtual BH Treatment Team Plan encounters: 1/4  Treatment Team Attendees: Nolon Rod, LCSW, Dr. Vanetta Shawl, Psychiatrist  Diagnoses:    ICD-10-CM   1. Generalized anxiety disorder  F41.1   2. MDD (major depressive disorder), single episode, severe , no psychosis (HCC)  F32.2     Goals, Interventions and Follow-up Plan Goals: Increase healthy adjustment to current life circumstances Interventions: Motivational Interviewing Solution-Focused Strategies Mindfulness or Relaxation Training Medication Management Recommendations: no changes Follow-up Plan: Weekly VBH services  History of the present illness Presenting Problem/Current Symptoms: anxiety  Psychiatric History  Depression: Yes Anxiety: Yes Mania: No Psychosis: No PTSD symptoms: No  Past Psychiatric History/Hospitalization(s): Hospitalization for psychiatric illness: No Prior Suicide Attempts: No Prior Self-injurious behavior: No  Psychosocial stressors School, work   Self-harm Behaviors Risk Assessment   Screenings PHQ-9 Assessments:  Depression screen [facility] 2/9 04/23/2021 03/06/2021  Decreased Interest 2 0  Down, Depressed, Hopeless 2 0  PHQ - 2 Score 4 0  Altered sleeping 2 -  Tired, decreased energy 3 -  Change in appetite 3 -  Feeling bad or failure about yourself  2 -  Trouble concentrating 2 -  Moving slowly or fidgety/restless 2 -  Suicidal thoughts 0 -  PHQ-9 Score 18 -  Difficult doing work/chores Very difficult -   GAD-7 Assessments:  GAD 7 : Generalized Anxiety Score 04/23/2021 03/06/2021  Nervous, Anxious, on Edge 3 3  Control/stop worrying 3 3  Worry too much - different things 3 3  Trouble relaxing 3 3  Restless 3 3  Easily annoyed or irritable 3 3  Afraid - awful might happen 3 3  Total GAD 7 Score 21 21   Anxiety Difficulty Somewhat difficult Very difficult    Past Medical History Past Medical History:  Diagnosis Date  . Anxiety   . Depression   . Depression    Phreesia 03/06/2021  . Headache(784.0)   . IBS (irritable bowel syndrome)     Vital signs: There were no vitals filed for this visit.  Allergies:  Allergies as of 04/24/2021  . (No Known Allergies)    Medication History Current medications:  Outpatient Encounter Medications as of 04/24/2021  Medication Sig  . amoxicillin-clavulanate (AUGMENTIN) 875-125 MG tablet Take 1 tablet by mouth 2 (two) times daily. One po bid x 7 days  . buPROPion (WELLBUTRIN XL) 150 MG 24 hr tablet Take 1 tablet (150 mg total) by mouth daily.  . fluticasone (FLONASE) 50 MCG/ACT nasal spray Place 2 sprays into both nostrils daily.  . ondansetron (ZOFRAN ODT) 4 MG disintegrating tablet 4mg  ODT q4 hours prn nausea/vomit   No facility-administered encounter medications on file as of 04/24/2021.     Scribe for Treatment Team: 06/24/2021, LCSW

## 2021-04-24 NOTE — Progress Notes (Signed)
Virtual behavioral Health Initiative (vBHI) Psychiatric Consultant Case Review   Dawn Mcpherson is a 20 y.o. year old female with a history of depression, anxiety,  hidradenitis suppurativa of right axilla. She took semester off due to mood symptoms. Psychosocial stressors including her grandfather's medical condition. She is on bupropion 150 mg daily, which was recently started. She has an upcoming appointment with therapist; will consider signing off after she established care with a therapist.   The above treatment considerations and suggestions are based on consultation with the [facility] specialist and/or PCP and a review of information available in the shared registry and the patient's Electronic Health Record (EHR). I have not personally examined the patient. All recommendations should be implemented with consideration of the patient's relevant prior history and current clinical status. Please feel free to call me with any questions about the care of this patient.

## 2021-04-30 ENCOUNTER — Ambulatory Visit (INDEPENDENT_AMBULATORY_CARE_PROVIDER_SITE_OTHER): Payer: PRIVATE HEALTH INSURANCE | Admitting: Psychology

## 2021-04-30 DIAGNOSIS — F411 Generalized anxiety disorder: Secondary | ICD-10-CM

## 2021-04-30 DIAGNOSIS — F410 Panic disorder [episodic paroxysmal anxiety] without agoraphobia: Secondary | ICD-10-CM

## 2021-04-30 DIAGNOSIS — F331 Major depressive disorder, recurrent, moderate: Secondary | ICD-10-CM | POA: Diagnosis not present

## 2021-05-01 LAB — CBC WITH DIFFERENTIAL/PLATELET
Basophils Absolute: 0 10*3/uL (ref 0.0–0.2)
Basos: 1 %
EOS (ABSOLUTE): 0.1 10*3/uL (ref 0.0–0.4)
Eos: 1 %
Hematocrit: 43.2 % (ref 34.0–46.6)
Hemoglobin: 14.2 g/dL (ref 11.1–15.9)
Immature Grans (Abs): 0 10*3/uL (ref 0.0–0.1)
Immature Granulocytes: 0 %
Lymphocytes Absolute: 2.3 10*3/uL (ref 0.7–3.1)
Lymphs: 28 %
MCH: 27.7 pg (ref 26.6–33.0)
MCHC: 32.9 g/dL (ref 31.5–35.7)
MCV: 84 fL (ref 79–97)
Monocytes Absolute: 0.4 10*3/uL (ref 0.1–0.9)
Monocytes: 5 %
Neutrophils Absolute: 5.5 10*3/uL (ref 1.4–7.0)
Neutrophils: 65 %
Platelets: 341 10*3/uL (ref 150–450)
RBC: 5.12 x10E6/uL (ref 3.77–5.28)
RDW: 12.3 % (ref 11.7–15.4)
WBC: 8.3 10*3/uL (ref 3.4–10.8)

## 2021-05-01 LAB — TSH+FREE T4
Free T4: 1.42 ng/dL (ref 0.93–1.60)
TSH: 2.08 u[IU]/mL (ref 0.450–4.500)

## 2021-05-01 LAB — CMP14+EGFR
ALT: 16 IU/L (ref 0–32)
AST: 13 IU/L (ref 0–40)
Albumin/Globulin Ratio: 1.7 (ref 1.2–2.2)
Albumin: 4.5 g/dL (ref 3.9–5.0)
Alkaline Phosphatase: 64 IU/L (ref 42–106)
BUN/Creatinine Ratio: 12 (ref 9–23)
BUN: 10 mg/dL (ref 6–20)
Bilirubin Total: 0.5 mg/dL (ref 0.0–1.2)
CO2: 21 mmol/L (ref 20–29)
Calcium: 9.5 mg/dL (ref 8.7–10.2)
Chloride: 104 mmol/L (ref 96–106)
Creatinine, Ser: 0.86 mg/dL (ref 0.57–1.00)
Globulin, Total: 2.6 g/dL (ref 1.5–4.5)
Glucose: 76 mg/dL (ref 65–99)
Potassium: 4.7 mmol/L (ref 3.5–5.2)
Sodium: 139 mmol/L (ref 134–144)
Total Protein: 7.1 g/dL (ref 6.0–8.5)
eGFR: 100 mL/min/{1.73_m2} (ref >59)

## 2021-05-01 LAB — LIPID PANEL WITH LDL/HDL RATIO
Cholesterol, Total: 156 mg/dL (ref 100–169)
HDL: 41 mg/dL (ref >39)
LDL Chol Calc (NIH): 104 mg/dL (ref 0–109)
LDL/HDL Ratio: 2.5 ratio (ref 0.0–3.2)
Triglycerides: 52 mg/dL (ref 0–89)
VLDL Cholesterol Cal: 11 mg/dL (ref 5–40)

## 2021-05-01 NOTE — Progress Notes (Signed)
Labs are great

## 2021-05-03 ENCOUNTER — Encounter: Payer: Self-pay | Admitting: Nurse Practitioner

## 2021-05-03 ENCOUNTER — Ambulatory Visit: Payer: PRIVATE HEALTH INSURANCE | Admitting: Nurse Practitioner

## 2021-05-03 ENCOUNTER — Other Ambulatory Visit: Payer: Self-pay

## 2021-05-03 DIAGNOSIS — K5900 Constipation, unspecified: Secondary | ICD-10-CM

## 2021-05-03 DIAGNOSIS — H6502 Acute serous otitis media, left ear: Secondary | ICD-10-CM | POA: Diagnosis not present

## 2021-05-03 MED ORDER — FLUTICASONE PROPIONATE 50 MCG/ACT NA SUSP: NASAL | 6 refills | Status: DC

## 2021-05-03 NOTE — Assessment & Plan Note (Signed)
-  Rx. flonase -if no improvement in 1 week with flonase will consider abx, but no sign of infection today, just serous fluid behind left TM

## 2021-05-03 NOTE — Progress Notes (Signed)
Acute Office Visit  Subjective:    Patient ID: Dawn Mcpherson, female    DOB: [birth year], 20 y.o.   MRN: [id number]  Chief Complaint  Patient presents with  . Otalgia    L ear pain x 3 days    HPI Patient is in today for left ear pain x 3 days. She states it feels like there is water in it.  She states that she is vomiting after eating. She is supposed to be taking miralax daily, but she is only taking that as needed.  Past Medical History:  Diagnosis Date  . Anxiety   . Depression   . Depression    Phreesia 03/06/2021  . Depression    Phreesia 04/30/2021  . Headache(784.0)   . IBS (irritable bowel syndrome)     Past Surgical History:  Procedure Laterality Date  . INCISION AND DRAINAGE      History reviewed. No pertinent family history.  Social History   Socioeconomic History  . Marital status: Single    Spouse name: Not on file  . Number of children: Not on file  . Years of education: Not on file  . Highest education level: Not on file  Occupational History  . Occupation: [employer]    Comment: Office manager  Tobacco Use  . Smoking status: Passive Smoke Exposure - Never Smoker  . Smokeless tobacco: Never Used  Vaping Use  . Vaping Use: Never used  Substance and Sexual Activity  . Alcohol use: No  . Drug use: No  . Sexual activity: Never  Other Topics Concern  . Not on file  Social History Narrative  . Not on file   Social Determinants of Health   Financial Resource Strain: Not on file  Food Insecurity: Not on file  Transportation Needs: Not on file  Physical Activity: Not on file  Stress: Not on file  Social Connections: Not on file  Intimate Partner Violence: Not on file    Outpatient Medications Prior to Visit  Medication Sig Dispense Refill  . buPROPion (WELLBUTRIN XL) 150 MG 24 hr tablet Take 1 tablet (150 mg total) by mouth daily. 30 tablet 0  . ondansetron (ZOFRAN ODT) 4 MG disintegrating tablet 73m ODT q4 hours prn  nausea/vomit 10 tablet 0  . amoxicillin-clavulanate (AUGMENTIN) 875-125 MG tablet Take 1 tablet by mouth 2 (two) times daily. One po bid x 7 days 14 tablet 0  . fluticasone (FLONASE) 50 MCG/ACT nasal spray Place 2 sprays into both nostrils daily. 9.9 g 0   No facility-administered medications prior to visit.    Allergies  Allergen Reactions  . Gluten Meal     Review of Systems  HENT: Positive for ear pain. Negative for congestion, rhinorrhea, sinus pressure, sinus pain and sore throat.   Respiratory: Negative.   Cardiovascular: Negative.   Gastrointestinal: Positive for abdominal distention, abdominal pain and constipation. Negative for diarrhea, nausea and vomiting.  Neurological: Positive for headaches.       Objective:    Physical Exam Constitutional:      Appearance: Normal appearance.  HENT:     Right Ear: Tympanic membrane, ear canal and external ear normal.     Left Ear: Tympanic membrane, ear canal and external ear normal.     Ears:     Comments: Serous fluid behind left TM Cardiovascular:     Rate and Rhythm: Normal rate and regular rhythm.     Pulses: Normal pulses.     Heart sounds: Normal  heart sounds.  Pulmonary:     Effort: Pulmonary effort is normal.     Breath sounds: Normal breath sounds.  Abdominal:     General: Abdomen is flat. Bowel sounds are normal. There is no distension.     Palpations: Abdomen is soft. There is no mass.     Tenderness: There is abdominal tenderness. There is no guarding or rebound.     Hernia: No hernia is present.     Comments: To RLQ  Neurological:     Mental Status: She is alert.     BP 116/83   Pulse 86   Temp 99.2 F (37.3 C)   Resp 20   Ht 5' 3"  (1.6 m)   Wt 208 lb (94.3 kg)   SpO2 98%   BMI 36.85 kg/m  Wt Readings from Last 3 Encounters:  05/03/21 208 lb (94.3 kg) (98 %, Z= 2.06)*  03/06/21 211 lb (95.7 kg) (98 %, Z= 2.10)*  08/29/20 208 lb (94.3 kg) (98 %, Z= 2.06)*   * Growth percentiles are based on  CDC (Girls, 2-20 Years) data.    Health Maintenance Due  Topic Date Due  . COVID-19 Vaccine (1) Never done  . HPV VACCINES (1 - 2-dose series) Never done  . HIV Screening  Never done  . Hepatitis C Screening  Never done  . TETANUS/TDAP  Never done       Topic Date Due  . HPV VACCINES (1 - 2-dose series) Never done     Lab Results  Component Value Date   TSH 2.080 04/30/2021   Lab Results  Component Value Date   WBC 8.3 04/30/2021   HGB 14.2 04/30/2021   HCT 43.2 04/30/2021   MCV 84 04/30/2021   PLT 341 04/30/2021   Lab Results  Component Value Date   NA 139 04/30/2021   K 4.7 04/30/2021   CO2 21 04/30/2021   GLUCOSE 76 04/30/2021   BUN 10 04/30/2021   CREATININE 0.86 04/30/2021   BILITOT 0.5 04/30/2021   ALKPHOS 64 04/30/2021   AST 13 04/30/2021   ALT 16 04/30/2021   PROT 7.1 04/30/2021   ALBUMIN 4.5 04/30/2021   CALCIUM 9.5 04/30/2021   ANIONGAP 10 08/29/2020   EGFR 100 04/30/2021   Lab Results  Component Value Date   CHOL 156 04/30/2021   Lab Results  Component Value Date   HDL 41 04/30/2021   Lab Results  Component Value Date   LDLCALC 104 04/30/2021   Lab Results  Component Value Date   TRIG 52 04/30/2021   Lab Results  Component Value Date   CHOLHDL 3.8 09/20/2014   No results found for: HGBA1C     Assessment & Plan:   Problem List Items Addressed This Visit      Nervous and Auditory   Acute serous otitis media of left ear without rupture    -Rx. flonase -if no improvement in 1 week with flonase will consider abx, but no sign of infection today, just serous fluid behind left TM      Relevant Medications   fluticasone (FLONASE) 50 MCG/ACT nasal spray     Other   Constipation    -saw pediatric GI 08/13/20; has hx of constipation -at that time was told: "1. Miralax 1-2 capfuls daily in 8-16 oz of water 2. If no bowel movement for two days take 10 mg of dulcolax  3. Needs 25-30 grams of fiber daily 4. Drink at least 64 oz of  water  daily  5. Okay to continue gluten free diet for non celiac gluten sensitivity  6. You will get call to schedule appointment in [facility]  7. Follow up with [place] as directed or Dr. Maricela Bo in 70month "  -she hasn't been taking miralax -has small BM yesterday, but last "good" BM was over a week ago -encouraged her to take miralax; if that doesn't help try dulcolax suppository, and then mag citrate if dulcolax doesn't work -referral to GI        Relevant Orders   Ambulatory referral to Gastroenterology       Meds ordered this encounter  Medications  . fluticasone (FLONASE) 50 MCG/ACT nasal spray    Sig: Use 2 sprays in each nostril BID for a week. After 1 week, decrease to 1 spray in each nostril BID as needed for congestion/allergies.    Dispense:  16 g    Refill:  6Stokes NP

## 2021-05-03 NOTE — Patient Instructions (Signed)
For GI issues, we did a referral to Dr. Karilyn Cota.  We will do a physical in a year since your labs were great. Please have fasting labs drawn 2-3 days prior to your appointment so we can discuss the results during your office visit.

## 2021-05-03 NOTE — Assessment & Plan Note (Signed)
-  saw pediatric GI 08/13/20; has hx of constipation -at that time was told: "1. Miralax 1-2 capfuls daily in 8-16 oz of water 2. If no bowel movement for two days take 10 mg of dulcolax  3. Needs 25-30 grams of fiber daily 4. Drink at least 64 oz of water daily  5. Okay to continue gluten free diet for non celiac gluten sensitivity  6. You will get call to schedule appointment in [facility]  7. Follow up with [facility] as directed or Dr. Clinton Sawyer in 10months "  -she hasn't been taking miralax -has small BM yesterday, but last "good" BM was over a week ago -encouraged her to take miralax; if that doesn't help try dulcolax suppository, and then mag citrate if dulcolax doesn't work -referral to GI

## 2021-05-06 ENCOUNTER — Encounter (INDEPENDENT_AMBULATORY_CARE_PROVIDER_SITE_OTHER): Payer: Self-pay | Admitting: *Deleted

## 2021-05-15 ENCOUNTER — Ambulatory Visit (INDEPENDENT_AMBULATORY_CARE_PROVIDER_SITE_OTHER): Payer: PRIVATE HEALTH INSURANCE | Admitting: Psychology

## 2021-05-15 DIAGNOSIS — F330 Major depressive disorder, recurrent, mild: Secondary | ICD-10-CM

## 2021-05-15 DIAGNOSIS — F410 Panic disorder [episodic paroxysmal anxiety] without agoraphobia: Secondary | ICD-10-CM | POA: Diagnosis not present

## 2021-05-15 DIAGNOSIS — F411 Generalized anxiety disorder: Secondary | ICD-10-CM

## 2021-05-30 ENCOUNTER — Other Ambulatory Visit: Payer: Self-pay | Admitting: Nurse Practitioner

## 2021-05-31 ENCOUNTER — Telehealth: Payer: Self-pay

## 2021-05-31 ENCOUNTER — Other Ambulatory Visit: Payer: Self-pay | Admitting: *Deleted

## 2021-05-31 MED ORDER — BUPROPION HCL ER (XL) 150 MG PO TB24
150.0000 mg | ORAL_TABLET | Freq: Every day | ORAL | 0 refills | Status: DC
Start: 2021-05-31 — End: 2021-07-23

## 2021-05-31 NOTE — Telephone Encounter (Signed)
Patient called need med refill:  buPROPion (WELLBUTRIN XL) 150 MG 24 hr tablet  Pharmacy:  [facility]

## 2021-05-31 NOTE — Telephone Encounter (Signed)
Medication sent to pharmacy  

## 2021-06-10 ENCOUNTER — Ambulatory Visit: Payer: PRIVATE HEALTH INSURANCE | Admitting: Psychology

## 2021-06-18 ENCOUNTER — Ambulatory Visit (HOSPITAL_COMMUNITY)
Admission: RE | Admit: 2021-06-18 | Discharge: 2021-06-18 | Disposition: A | Discharge status: Home / Self Care | Payer: PRIVATE HEALTH INSURANCE | Source: Ambulatory Visit | Attending: Nurse Practitioner | Admitting: Nurse Practitioner

## 2021-06-18 ENCOUNTER — Other Ambulatory Visit: Payer: Self-pay

## 2021-06-18 ENCOUNTER — Ambulatory Visit: Payer: PRIVATE HEALTH INSURANCE | Admitting: Nurse Practitioner

## 2021-06-18 ENCOUNTER — Encounter: Payer: Self-pay | Admitting: Nurse Practitioner

## 2021-06-18 VITALS — BP 116/80 | HR 87 | Temp 98.6°F | Resp 20 | Ht 63.0 in | Wt 209.0 lb

## 2021-06-18 DIAGNOSIS — M79672 Pain in left foot: Secondary | ICD-10-CM

## 2021-06-18 MED ORDER — CRUTCH SET MISC: 0 refills | Status: DC

## 2021-06-18 MED ORDER — IBUPROFEN 800 MG PO TABS: 800.0000 mg | ORAL_TABLET | Freq: Three times a day (TID) | ORAL | 0 refills | Status: DC | PRN

## 2021-06-18 NOTE — Patient Instructions (Signed)
If the x-ray is negative for fracture, return to clinic if you don't have any improvement in 1 week.

## 2021-06-18 NOTE — Progress Notes (Signed)
Acute Office Visit  Subjective:    Patient ID: Dawn Mcpherson, female    DOB: [birth year], 20 y.o.   MRN: [id number]  Chief Complaint  Patient presents with   Foot Pain    L foot pain after tripping on 06/17/21.     Foot Pain  Patient is in today for left foot pain that started after tripping on 06/17/21. She states that she was at work and "kind of tripped" and her great toe started throbbing.  She has a swollen great toe and is having pain on the top of her foot.  Past Medical History:  Diagnosis Date   Anxiety    Depression    Depression    Phreesia 03/06/2021   Depression    Phreesia 04/30/2021   Headache(784.0)    IBS (irritable bowel syndrome)     Past Surgical History:  Procedure Laterality Date   INCISION AND DRAINAGE      History reviewed. No pertinent family history.  Social History   Socioeconomic History   Marital status: Single    Spouse name: Not on file   Number of children: Not on file   Years of education: Not on file   Highest education level: Not on file  Occupational History   Occupation: [employer]    Comment: Office manager  Tobacco Use   Smoking status: Never    Passive exposure: Yes   Smokeless tobacco: Never  Vaping Use   Vaping Use: Never used  Substance and Sexual Activity   Alcohol use: No   Drug use: No   Sexual activity: Never  Other Topics Concern   Not on file  Social History Narrative   Not on file   Social Determinants of Health   Financial Resource Strain: Not on file  Food Insecurity: Not on file  Transportation Needs: Not on file  Physical Activity: Not on file  Stress: Not on file  Social Connections: Not on file  Intimate Partner Violence: Not on file    Outpatient Medications Prior to Visit  Medication Sig Dispense Refill   buPROPion (WELLBUTRIN XL) 150 MG 24 hr tablet Take 1 tablet (150 mg total) by mouth daily. 30 tablet 0   fluticasone (FLONASE) 50 MCG/ACT nasal spray Use 2 sprays in each  nostril BID for a week. After 1 week, decrease to 1 spray in each nostril BID as needed for congestion/allergies. 16 g 6   ondansetron (ZOFRAN ODT) 4 MG disintegrating tablet 79m ODT q4 hours prn nausea/vomit 10 tablet 0   No facility-administered medications prior to visit.    Allergies  Allergen Reactions   Gluten Meal     Review of Systems  Constitutional: Negative.   Respiratory: Negative.    Cardiovascular: Negative.   Musculoskeletal:        Left foot pain per HPI      Objective:    Physical Exam Constitutional:      Appearance: Normal appearance.  Musculoskeletal:        General: Swelling, tenderness and signs of injury present.     Comments: To left great toe/foot  Neurological:     Mental Status: She is alert.    BP 116/80 (BP Location: Right Arm, Patient Position: Sitting, Cuff Size: Large)   Pulse 87   Temp 98.6 F (37 C)   Resp 20   Ht 5' 3"  (1.6 m)   Wt 209 lb (94.8 kg)   SpO2 98%   BMI 37.02 kg/m  Wt Readings  from Last 3 Encounters:  06/18/21 209 lb (94.8 kg) (98 %, Z= 2.07)*  05/03/21 208 lb (94.3 kg) (98 %, Z= 2.06)*  03/06/21 211 lb (95.7 kg) (98 %, Z= 2.10)*   * Growth percentiles are based on CDC (Girls, 2-20 Years) data.    Health Maintenance Due  Topic Date Due   COVID-19 Vaccine (1) Never done   HPV VACCINES (1 - 2-dose series) Never done   HIV Screening  Never done   Hepatitis C Screening  Never done   TETANUS/TDAP  Never done       Topic Date Due   HPV VACCINES (1 - 2-dose series) Never done     Lab Results  Component Value Date   TSH 2.080 04/30/2021   Lab Results  Component Value Date   WBC 8.3 04/30/2021   HGB 14.2 04/30/2021   HCT 43.2 04/30/2021   MCV 84 04/30/2021   PLT 341 04/30/2021   Lab Results  Component Value Date   NA 139 04/30/2021   K 4.7 04/30/2021   CO2 21 04/30/2021   GLUCOSE 76 04/30/2021   BUN 10 04/30/2021   CREATININE 0.86 04/30/2021   BILITOT 0.5 04/30/2021   ALKPHOS 64 04/30/2021    AST 13 04/30/2021   ALT 16 04/30/2021   PROT 7.1 04/30/2021   ALBUMIN 4.5 04/30/2021   CALCIUM 9.5 04/30/2021   ANIONGAP 10 08/29/2020   EGFR 100 04/30/2021   Lab Results  Component Value Date   CHOL 156 04/30/2021   Lab Results  Component Value Date   HDL 41 04/30/2021   Lab Results  Component Value Date   LDLCALC 104 04/30/2021   Lab Results  Component Value Date   TRIG 52 04/30/2021   Lab Results  Component Value Date   CHOLHDL 3.8 09/20/2014   No results found for: HGBA1C     Assessment & Plan:   Problem List Items Addressed This Visit       Other   Left foot pain - Primary    -tripped at work yesterday and her left great toe was under her foot, and put all of her weight on her great toe that had been bent under -having throbbing pain now that is worse than yesterday -can walk on foot by inverting her foot -Rx. Ibuprofen -will get imaging; consider ortho consult based on imaging -Rx. crutches       Relevant Medications   Misc. Devices (CRUTCH SET) MISC   Other Relevant Orders   DG Foot Complete Left     Meds ordered this encounter  Medications   ibuprofen (ADVIL) 800 MG tablet    Sig: Take 1 tablet (800 mg total) by mouth every 8 (eight) hours as needed.    Dispense:  30 tablet    Refill:  0   Misc. Devices (CRUTCH SET) MISC    Sig: Use set of crutches to aid with ambulation.    Dispense:  1 each    Refill:  0    P80.998     Noreene Larsson, NP

## 2021-06-18 NOTE — Assessment & Plan Note (Addendum)
-  tripped at work yesterday and her left great toe was under her foot, and put all of her weight on her great toe that had been bent under -having throbbing pain now that is worse than yesterday -can walk on foot by inverting her foot -Rx. Ibuprofen -will get imaging; consider ortho consult based on imaging -Rx. crutches

## 2021-06-19 NOTE — Progress Notes (Signed)
X-ray was negative for fracture. If pain persists in a week, let us know and we will consider ortho consult.

## 2021-06-21 ENCOUNTER — Encounter: Payer: Self-pay | Admitting: Nurse Practitioner

## 2021-07-08 ENCOUNTER — Ambulatory Visit (INDEPENDENT_AMBULATORY_CARE_PROVIDER_SITE_OTHER): Payer: PRIVATE HEALTH INSURANCE | Admitting: Psychology

## 2021-07-08 DIAGNOSIS — F411 Generalized anxiety disorder: Secondary | ICD-10-CM | POA: Diagnosis not present

## 2021-07-08 DIAGNOSIS — F410 Panic disorder [episodic paroxysmal anxiety] without agoraphobia: Secondary | ICD-10-CM | POA: Diagnosis not present

## 2021-07-08 DIAGNOSIS — F330 Major depressive disorder, recurrent, mild: Secondary | ICD-10-CM | POA: Diagnosis not present

## 2021-07-21 ENCOUNTER — Encounter: Payer: Self-pay | Admitting: Nurse Practitioner

## 2021-07-23 ENCOUNTER — Other Ambulatory Visit: Payer: Self-pay | Admitting: Nurse Practitioner

## 2021-07-23 ENCOUNTER — Ambulatory Visit: Payer: PRIVATE HEALTH INSURANCE | Admitting: Psychology

## 2021-07-23 MED ORDER — BUPROPION HCL ER (XL) 300 MG PO TB24: 300.0000 mg | ORAL_TABLET | Freq: Every day | ORAL | 1 refills | Status: DC

## 2021-07-23 NOTE — Progress Notes (Signed)
-  pt called reporting increased anxiety -INCREASE wellbutrin -f/u in 1 month

## 2021-07-23 NOTE — Telephone Encounter (Signed)
I increased wellbutrin to 300 mg daily. We need to set up an appointment for her in 1 month for a med check.

## 2021-08-06 ENCOUNTER — Ambulatory Visit: Payer: PRIVATE HEALTH INSURANCE | Admitting: Psychology

## 2021-08-16 ENCOUNTER — Telehealth (INDEPENDENT_AMBULATORY_CARE_PROVIDER_SITE_OTHER): Payer: PRIVATE HEALTH INSURANCE | Admitting: Internal Medicine

## 2021-08-16 ENCOUNTER — Other Ambulatory Visit: Payer: Self-pay

## 2021-08-16 ENCOUNTER — Encounter: Payer: Self-pay | Admitting: Internal Medicine

## 2021-08-16 DIAGNOSIS — R112 Nausea with vomiting, unspecified: Secondary | ICD-10-CM

## 2021-08-16 DIAGNOSIS — Z20822 Contact with and (suspected) exposure to covid-19: Secondary | ICD-10-CM

## 2021-08-16 DIAGNOSIS — J069 Acute upper respiratory infection, unspecified: Secondary | ICD-10-CM

## 2021-08-16 LAB — POCT INFLUENZA A/B
Influenza A, POC: NEGATIVE
Influenza B, POC: NEGATIVE

## 2021-08-16 MED ORDER — ONDANSETRON 4 MG PO TBDP: 4.0000 mg | ORAL_TABLET | Freq: Three times a day (TID) | ORAL | 0 refills | Status: DC | PRN

## 2021-08-16 NOTE — Addendum Note (Signed)
Addended by: Dellia Cloud on: 08/16/2021 01:06 PM   Modules accepted: Orders

## 2021-08-16 NOTE — Patient Instructions (Signed)
Please take Zofran only as needed for nausea/vomiting.  Please self-quarantine for now while waiting for COVID test result.

## 2021-08-16 NOTE — Progress Notes (Addendum)
Virtual Visit via Telephone Note   This visit type was conducted due to national recommendations for restrictions regarding the COVID-19 Pandemic (e.g. social distancing) in an effort to limit this patient's exposure and mitigate transmission in our community.  Due to her co-morbid illnesses, this patient is at least at moderate risk for complications without adequate follow up.  This format is felt to be most appropriate for this patient at this time.  The patient did not have access to video technology/had technical difficulties with video requiring transitioning to audio format only (telephone).  All issues noted in this document were discussed and addressed.  No physical exam could be performed with this format.   Evaluation Performed:  Follow-up visit  Date:  08/16/2021   ID:  Dawn Mcpherson, DOB 2001/07/13, MRN [id number]  Patient Location: Home Provider Location: Office/Clinic  Participants: Patient Location of Patient: Home Location of Provider: Telehealth Consent was obtain for visit to be over via telehealth. I verified that I am speaking with the correct person using two identifiers.  PCP:  Heather Roberts, NP   Chief Complaint:  Vomiting, cough  History of Present Illness:    Dawn Mcpherson is a 20 y.o. female who has a televisit for c/o nausea, vomiting and cough since this morning. She has been feeling warm, but has not checked her temperature. She denies any dyspnea or wheezing. She has not had COVID vaccine. Denies any known sick contacts.  The patient does have symptoms concerning for COVID-19 infection (fever, chills, cough, or new shortness of breath).   Past Medical, Surgical, Social History, Allergies, and Medications have been Reviewed.  Past Medical History:  Diagnosis Date   Anxiety    Depression    Depression    Phreesia 03/06/2021   Depression    Phreesia 04/30/2021   Headache(784.0)    IBS (irritable bowel syndrome)    Past Surgical History:   Procedure Laterality Date   INCISION AND DRAINAGE       Current Meds  Medication Sig   buPROPion (WELLBUTRIN XL) 300 MG 24 hr tablet Take 1 tablet (300 mg total) by mouth daily.   fluticasone (FLONASE) 50 MCG/ACT nasal spray Use 2 sprays in each nostril BID for a week. After 1 week, decrease to 1 spray in each nostril BID as needed for congestion/allergies.   ibuprofen (ADVIL) 800 MG tablet Take 1 tablet (800 mg total) by mouth every 8 (eight) hours as needed.   Misc. Devices (CRUTCH SET) MISC Use set of crutches to aid with ambulation.   [DISCONTINUED] ondansetron (ZOFRAN ODT) 4 MG disintegrating tablet 4mg  ODT q4 hours prn nausea/vomit     Allergies:   Gluten meal   ROS:   Please see the history of present illness.     All other systems reviewed and are negative.   Labs/Other Tests and Data Reviewed:    Recent Labs: 04/30/2021: ALT 16; BUN 10; Creatinine, Ser 0.86; Hemoglobin 14.2; Platelets 341; Potassium 4.7; Sodium 139; TSH 2.080   Recent Lipid Panel Lab Results  Component Value Date/Time   CHOL 156 04/30/2021 09:59 AM   TRIG 52 04/30/2021 09:59 AM   HDL 41 04/30/2021 09:59 AM   CHOLHDL 3.8 09/20/2014 07:10 AM   LDLCALC 104 04/30/2021 09:59 AM    Wt Readings from Last 3 Encounters:  06/18/21 209 lb (94.8 kg) (98 %, Z= 2.07)*  05/03/21 208 lb (94.3 kg) (98 %, Z= 2.06)*  03/06/21 211 lb (95.7 kg) (98 %,  Z= 2.10)*   * Growth percentiles are based on CDC (Girls, 2-20 Years) data.     ASSESSMENT & PLAN:    Suspected COVID infection Check COVID RT-PCR and flu swab Symptomatic treatment for now Zofran PRN for vomiting Self-quarantine for now  Addendum: Spoke to the patient today for follow up. She has cough, nasal congestion and fatigue. COVID and flu tests were negative. Started Azithromycin for bacterial coverage for URTI.  Work note provided.  Time:   Today, I have spent 13 minutes reviewing the chart, including problem list, medications, and with the  patient with telehealth technology discussing the above problems.   Medication Adjustments/Labs and Tests Ordered: Current medicines are reviewed at length with the patient today.  Concerns regarding medicines are outlined above.   Tests Ordered: No orders of the defined types were placed in this encounter.   Medication Changes: Meds ordered this encounter  Medications   ondansetron (ZOFRAN ODT) 4 MG disintegrating tablet    Sig: Take 1 tablet (4 mg total) by mouth every 8 (eight) hours as needed for nausea or vomiting. 4mg  ODT q4 hours prn nausea/vomit    Dispense:  15 tablet    Refill:  0     Note: This dictation was prepared with Dragon dictation along with smaller phrase technology. Similar sounding words can be transcribed inadequately or may not be corrected upon review. Any transcriptional errors that result from this process are unintentional.      Disposition:  Follow up  Signed, , MD  08/16/2021 11:08 AM     08/18/2021 [facility]

## 2021-08-18 LAB — SARS-COV-2, NAA 2 DAY TAT

## 2021-08-18 LAB — NOVEL CORONAVIRUS, NAA: SARS-CoV-2, NAA: NOT DETECTED

## 2021-08-19 ENCOUNTER — Ambulatory Visit (INDEPENDENT_AMBULATORY_CARE_PROVIDER_SITE_OTHER): Payer: PRIVATE HEALTH INSURANCE | Admitting: Psychology

## 2021-08-19 DIAGNOSIS — F411 Generalized anxiety disorder: Secondary | ICD-10-CM

## 2021-08-19 DIAGNOSIS — F4310 Post-traumatic stress disorder, unspecified: Secondary | ICD-10-CM

## 2021-08-20 MED ORDER — AZITHROMYCIN 250 MG PO TABS: ORAL_TABLET | ORAL | 0 refills | Status: AC

## 2021-08-20 NOTE — Addendum Note (Signed)
Addended byTrena Platt on: 08/20/2021 01:36 PM   Modules accepted: Orders, Level of Service

## 2021-08-30 ENCOUNTER — Emergency Department (HOSPITAL_COMMUNITY): Payer: PRIVATE HEALTH INSURANCE

## 2021-08-30 ENCOUNTER — Encounter (HOSPITAL_COMMUNITY): Payer: Self-pay | Admitting: Emergency Medicine

## 2021-08-30 ENCOUNTER — Other Ambulatory Visit: Payer: Self-pay

## 2021-08-30 ENCOUNTER — Telehealth: Payer: PRIVATE HEALTH INSURANCE | Admitting: Nurse Practitioner

## 2021-08-30 ENCOUNTER — Emergency Department (HOSPITAL_COMMUNITY)
Admission: EM | Admit: 2021-08-30 | Discharge: 2021-08-31 | Disposition: A | Discharge status: Home / Self Care | Payer: PRIVATE HEALTH INSURANCE | Attending: Emergency Medicine | Admitting: Emergency Medicine

## 2021-08-30 DIAGNOSIS — S0990XA Unspecified injury of head, initial encounter: Secondary | ICD-10-CM | POA: Diagnosis not present

## 2021-08-30 DIAGNOSIS — R112 Nausea with vomiting, unspecified: Secondary | ICD-10-CM | POA: Diagnosis not present

## 2021-08-30 DIAGNOSIS — W109XXA Fall (on) (from) unspecified stairs and steps, initial encounter: Secondary | ICD-10-CM | POA: Diagnosis not present

## 2021-08-30 DIAGNOSIS — Z5321 Procedure and treatment not carried out due to patient leaving prior to being seen by health care provider: Secondary | ICD-10-CM | POA: Insufficient documentation

## 2021-08-30 DIAGNOSIS — G43909 Migraine, unspecified, not intractable, without status migrainosus: Secondary | ICD-10-CM | POA: Diagnosis not present

## 2021-08-30 DIAGNOSIS — G44311 Acute post-traumatic headache, intractable: Secondary | ICD-10-CM

## 2021-08-30 DIAGNOSIS — R42 Dizziness and giddiness: Secondary | ICD-10-CM | POA: Diagnosis not present

## 2021-08-30 DIAGNOSIS — R519 Headache, unspecified: Secondary | ICD-10-CM | POA: Diagnosis present

## 2021-08-30 LAB — URINALYSIS, ROUTINE W REFLEX MICROSCOPIC
Bilirubin Urine: NEGATIVE
Glucose, UA: NEGATIVE mg/dL
Hgb urine dipstick: NEGATIVE
Ketones, ur: NEGATIVE mg/dL
Leukocytes,Ua: NEGATIVE
Nitrite: NEGATIVE
Protein, ur: NEGATIVE mg/dL
Specific Gravity, Urine: >1.03 — ABNORMAL HIGH (ref 1.005–1.030)
pH: 5.5 (ref 5.0–8.0)

## 2021-08-30 LAB — CBC WITH DIFFERENTIAL/PLATELET
Abs Immature Granulocytes: 0.05 10*3/uL (ref 0.00–0.07)
Basophils Absolute: 0.1 10*3/uL (ref 0.0–0.1)
Basophils Relative: 0 %
Eosinophils Absolute: 0.1 10*3/uL (ref 0.0–0.5)
Eosinophils Relative: 0 %
HCT: 43.7 % (ref 36.0–46.0)
Hemoglobin: 14.8 g/dL (ref 12.0–15.0)
Immature Granulocytes: 0 %
Lymphocytes Relative: 20 %
Lymphs Abs: 3.2 10*3/uL (ref 0.7–4.0)
MCH: 28.1 pg (ref 26.0–34.0)
MCHC: 33.9 g/dL (ref 30.0–36.0)
MCV: 82.9 fL (ref 80.0–100.0)
Monocytes Absolute: 0.8 10*3/uL (ref 0.1–1.0)
Monocytes Relative: 5 %
Neutro Abs: 11.3 10*3/uL — ABNORMAL HIGH (ref 1.7–7.7)
Neutrophils Relative %: 75 %
Platelets: 393 10*3/uL (ref 150–400)
RBC: 5.27 MIL/uL — ABNORMAL HIGH (ref 3.87–5.11)
RDW: 12.4 % (ref 11.5–15.5)
WBC: 15.4 10*3/uL — ABNORMAL HIGH (ref 4.0–10.5)
nRBC: 0 % (ref 0.0–0.2)

## 2021-08-30 LAB — LIPASE, BLOOD: Lipase: 31 U/L (ref 11–51)

## 2021-08-30 LAB — COMPREHENSIVE METABOLIC PANEL
ALT: 23 U/L (ref 0–44)
AST: 17 U/L (ref 15–41)
Albumin: 4.4 g/dL (ref 3.5–5.0)
Alkaline Phosphatase: 54 U/L (ref 38–126)
Anion gap: 7 (ref 5–15)
BUN: 10 mg/dL (ref 6–20)
CO2: 26 mmol/L (ref 22–32)
Calcium: 9.7 mg/dL (ref 8.9–10.3)
Chloride: 107 mmol/L (ref 98–111)
Creatinine, Ser: 0.74 mg/dL (ref 0.44–1.00)
GFR, Estimated: >60 mL/min (ref >60)
Glucose, Bld: 88 mg/dL (ref 70–99)
Potassium: 4.1 mmol/L (ref 3.5–5.1)
Sodium: 140 mmol/L (ref 135–145)
Total Bilirubin: 0.5 mg/dL (ref 0.3–1.2)
Total Protein: 7.2 g/dL (ref 6.5–8.1)

## 2021-08-30 LAB — I-STAT BETA HCG BLOOD, ED (MC, WL, AP ONLY): I-stat hCG, quantitative: <5 m[IU]/mL (ref <5)

## 2021-08-30 NOTE — ED Provider Notes (Signed)
Emergency Medicine Provider Triage Evaluation Note  Dawn Mcpherson , a 20 y.o. female  was evaluated in triage.  Pt complains of headache and vomiting.  She last week and fell down a flight of stairs.  She does not specifically recall striking her head.  Fall was mechanical nonsyncopal.  Since then she has multiple times.  She states that she has a severe throbbing headache.  She is not having any vision changes.  Does not take any blood thinning medications..  Review of Systems  Positive: Headache, vomiting Negative: Syncope, no neck pain  Physical Exam  BP (!) 126/92 (BP Location: Left Arm)   Pulse 100   Temp 99.6 F (37.6 C) (Oral)   Resp 14   SpO2 99%  Gen:   Awake, no distress   Resp:  Normal effort  MSK:   Moves extremities without difficulty  Other:  Appears to feel unwell  Medical Decision Making  Medically screening exam initiated at 9:13 PM.  Appropriate orders placed.  Jemmie Ledgerwood Gatlin was informed that the remainder of the evaluation will be completed by another provider, this initial triage assessment does not replace that evaluation, and the importance of remaining in the ED until their evaluation is complete.  Note: Portions of this report may have been transcribed using voice recognition software. Every effort was made to ensure accuracy; however, inadvertent computerized transcription errors may be present    Norman Clay 08/30/21 2115    Charlynne Pander, MD 08/30/21 [phone]

## 2021-08-30 NOTE — ED Triage Notes (Signed)
Patient states that she fell down stairs on Sunday, unknown if she hit her head.  She states that she has been having headache and vomiting since.  She states that she does have migraines, but this is different than her migraines.  She does have dizziness with the migraine.

## 2021-08-30 NOTE — Progress Notes (Signed)
Virtual Visit Consent   Dawn Mcpherson, you are scheduled for a virtual visit with Dawn Daphine Deutscher, FNP, a [facility] provider, today.     Just as with appointments in the office, your consent must be obtained to participate.  Your consent will be active for this visit and any virtual visit you may have with one of our providers in the next 365 days.     If you have a MyChart account, a copy of this consent can be sent to you electronically.  All virtual visits are billed to your insurance company just like a traditional visit in the office.    As this is a virtual visit, video technology does not allow for your provider to perform a traditional examination.  This may limit your provider's ability to fully assess your condition.  If your provider identifies any concerns that need to be evaluated in person or the need to arrange testing (such as labs, EKG, etc.), we will make arrangements to do so.     Although advances in technology are sophisticated, we cannot ensure that it will always work on either your end or our end.  If the connection with a video visit is poor, the visit may have to be switched to a telephone visit.  With either a video or telephone visit, we are not always able to ensure that we have a secure connection.     I need to obtain your verbal consent now.   Are you willing to proceed with your visit today? YES   Debhora Titus Deroos has provided verbal consent on 08/30/2021 for a virtual visit (video or telephone).   Dawn Daphine Deutscher, FNP   Date: 08/30/2021 6:41 PM   Virtual Visit via Video Note   I, Dawn Mcpherson, connected with Shaguana Love Peloquin ([id number], 03/30/2001) on 08/30/21 at  6:30 PM EDT by a video-enabled telemedicine application and verified that I am speaking with the correct person using two identifiers.  Location: Patient: Virtual Visit Location Patient: Other: moms car Provider: Virtual Visit Location Provider: Mobile   I discussed the  limitations of evaluation and management by telemedicine and the availability of in person appointments. The patient expressed understanding and agreed to proceed.    History of Present Illness: Dawn Mcpherson is a 20 y.o. who identifies as a female who was assigned female at birth, and is being seen today for headache, nausea and vomiting.  HPI:   Patient had strep throat 1 week ago with vomiting. She was treated and got over it. She states she had a fall Sunday in [place] coming down some steps. Not sure if hit her head. Did not knock her out. But every since then she has been having pounding head ache. The pain is pulsating throbbing pain that will not go away. She has been taking tylenol and ibuprofen. If she can go to sleep it goes away but as soon as she wakes up she has the headache again. She was incar with her mom just prior tpo visit she threw up.   Review of Systems  Constitutional:  Negative for chills and fever.  HENT: Negative.    Cardiovascular: Negative.   Gastrointestinal:  Positive for nausea and vomiting.  Neurological:  Positive for dizziness and headaches.   Problems:  Patient Active Problem List   Diagnosis Date Noted   Left foot pain 06/18/2021   Acute serous otitis media of left ear without rupture 05/03/2021   Constipation 05/03/2021   Encounter  to establish care 03/06/2021   ADHD 03/06/2021   Weight gain, abnormal 03/06/2021   Hidradenitis suppurativa of right axilla 03/06/2021   MDD (major depressive disorder), single episode, severe , no psychosis (HCC) 09/20/2014   Generalized anxiety disorder 09/20/2014    Allergies:  Allergies  Allergen Reactions   Gluten Meal    Medications:  Current Outpatient Medications:    buPROPion (WELLBUTRIN XL) 300 MG 24 hr tablet, Take 1 tablet (300 mg total) by mouth daily., Disp: 90 tablet, Rfl: 1   fluticasone (FLONASE) 50 MCG/ACT nasal spray, Use 2 sprays in each nostril BID for a week. After 1 week, decrease to 1 spray  in each nostril BID as needed for congestion/allergies., Disp: 16 g, Rfl: 6   ibuprofen (ADVIL) 800 MG tablet, Take 1 tablet (800 mg total) by mouth every 8 (eight) hours as needed., Disp: 30 tablet, Rfl: 0   Misc. Devices (CRUTCH SET) MISC, Use set of crutches to aid with ambulation., Disp: 1 each, Rfl: 0   ondansetron (ZOFRAN ODT) 4 MG disintegrating tablet, Take 1 tablet (4 mg total) by mouth every 8 (eight) hours as needed for nausea or vomiting. 4mg  ODT q4 hours prn nausea/vomit, Disp: 15 tablet, Rfl: 0  Observations/Objective: Patient is well-developed, well-nourished in no acute distress. .  Head is normocephalic, atraumatic.  No labored breathing.  Speech is clear and coherent with logical content.  Patient is alert and oriented at baseline.    Assessment and Plan:  [name] in today with chief complaint of No chief complaint on file.   1. Acute head injury without loss of consciousness, initial encounter  2. Intractable acute post-traumatic headache  3. Non-intractable vomiting with nausea, unspecified vomiting type Need to go to the ED for head CT and further work uup.   The above assessment and management plan was discussed with the patient. The patient verbalized understanding of and has agreed to the management plan. Patient is aware to call the clinic if symptoms persist or worsen. Patient is aware when to return to the clinic for a follow-up visit. Patient educated on when it is appropriate to go to the emergency department.   Dawn Mauri Reading, FNP    Follow Up Instructions: I discussed the assessment and treatment plan with the patient. The patient was provided an opportunity to ask questions and all were answered. The patient agreed with the plan and demonstrated an understanding of the instructions.  A copy of instructions were sent to the patient via MyChart.  The patient was advised to call back or seek an in-person evaluation if the symptoms worsen or  if the condition fails to improve as anticipated.  Time:  I spent 11 minutes with the patient via telehealth technology discussing the above problems/concerns.    Dawn Daphine Deutscher, FNP

## 2021-08-31 ENCOUNTER — Encounter (HOSPITAL_BASED_OUTPATIENT_CLINIC_OR_DEPARTMENT_OTHER): Payer: Self-pay | Admitting: Emergency Medicine

## 2021-08-31 ENCOUNTER — Telehealth: Payer: PRIVATE HEALTH INSURANCE | Admitting: Nurse Practitioner

## 2021-08-31 ENCOUNTER — Ambulatory Visit (HOSPITAL_COMMUNITY)
Admission: EM | Admit: 2021-08-31 | Disposition: A | Discharge status: Home / Self Care | Payer: PRIVATE HEALTH INSURANCE

## 2021-08-31 ENCOUNTER — Emergency Department (HOSPITAL_BASED_OUTPATIENT_CLINIC_OR_DEPARTMENT_OTHER)
Admission: EM | Admit: 2021-08-31 | Discharge: 2021-08-31 | Disposition: A | Discharge status: Home / Self Care | Payer: PRIVATE HEALTH INSURANCE | Source: Home / Self Care

## 2021-08-31 DIAGNOSIS — R519 Headache, unspecified: Secondary | ICD-10-CM

## 2021-08-31 DIAGNOSIS — Z5321 Procedure and treatment not carried out due to patient leaving prior to being seen by health care provider: Secondary | ICD-10-CM | POA: Insufficient documentation

## 2021-08-31 MED ORDER — KETOROLAC TROMETHAMINE 30 MG/ML IJ SOLN
30.0000 mg | Freq: Once | INTRAMUSCULAR | Status: AC
  Administered 2021-08-31: 30 mg via INTRAVENOUS
  Filled 2021-08-31: qty 1

## 2021-08-31 MED ORDER — PROCHLORPERAZINE EDISYLATE 10 MG/2ML IJ SOLN
10.0000 mg | Freq: Once | INTRAMUSCULAR | Status: AC
  Administered 2021-08-31: 10 mg via INTRAVENOUS
  Filled 2021-08-31: qty 2

## 2021-08-31 MED ORDER — DIPHENHYDRAMINE HCL 50 MG/ML IJ SOLN
25.0000 mg | Freq: Once | INTRAMUSCULAR | Status: AC
  Administered 2021-08-31: 25 mg via INTRAVENOUS
  Filled 2021-08-31: qty 1

## 2021-08-31 MED ORDER — SODIUM CHLORIDE 0.9 % IV BOLUS
1000.0000 mL | Freq: Once | INTRAVENOUS | Status: AC
  Administered 2021-08-31: 1000 mL via INTRAVENOUS

## 2021-08-31 NOTE — Progress Notes (Signed)
Based on what you shared with me it looks like you have headache,that should be evaluated in a face to face office visit. You need to go back to the ED to complete evaluation and get treated.  NOTE: There will be NO CHARGE for this eVisit   If you are having a true medical emergency please call 911.      For an urgent face to face visit, [facility] has six urgent care centers for your convenience:     [facility] at [facility] Directions [phone] [address] [place], [place] [address]    [facility] Health Urgent Care Center [facility]) Get Driving Directions [phone] [address] [place], [place] [address]  [facility] Health Urgent Care Center [facility]) Get Driving Directions [phone] [address] [place],  [place]  [address]  [facility]gent Care at [facility] Get Driving Directions [phone] [address] [place], [place] [address]   [facility] Urgent Care at [facility] Get Driving Directions  [phone] [address] [place], [place] [address]   [facility] Health Urgent Care at [facility] Directions [phone] [address] [place], [place] [address]  Your MyChart E-visit questionnaire answers were reviewed by a board certified advanced clinical practitioner to complete your personal care plan based on your specific symptoms.  Thank you for using e-Visits.

## 2021-08-31 NOTE — ED Triage Notes (Signed)
Pt was seen at ed last night, had labs and ct done. She was told she was dehydrated but oculdn't wait 4 more hours for a room. Pt returns today with same symptoms.

## 2021-08-31 NOTE — ED Notes (Signed)
Pt stated she was leaving.  

## 2021-09-09 ENCOUNTER — Ambulatory Visit: Payer: PRIVATE HEALTH INSURANCE | Admitting: Psychology

## 2021-09-16 ENCOUNTER — Ambulatory Visit (INDEPENDENT_AMBULATORY_CARE_PROVIDER_SITE_OTHER): Payer: PRIVATE HEALTH INSURANCE | Admitting: Gastroenterology

## 2021-09-23 ENCOUNTER — Ambulatory Visit (INDEPENDENT_AMBULATORY_CARE_PROVIDER_SITE_OTHER): Payer: PRIVATE HEALTH INSURANCE | Admitting: Gastroenterology

## 2021-09-23 ENCOUNTER — Encounter (INDEPENDENT_AMBULATORY_CARE_PROVIDER_SITE_OTHER): Payer: Self-pay | Admitting: Gastroenterology

## 2021-09-23 ENCOUNTER — Other Ambulatory Visit: Payer: Self-pay

## 2021-09-23 VITALS — BP 112/80 | HR 79 | Temp 98.1°F | Ht 63.0 in | Wt 210.4 lb

## 2021-09-23 DIAGNOSIS — R112 Nausea with vomiting, unspecified: Secondary | ICD-10-CM | POA: Diagnosis not present

## 2021-09-23 DIAGNOSIS — K5900 Constipation, unspecified: Secondary | ICD-10-CM | POA: Diagnosis not present

## 2021-09-23 DIAGNOSIS — R1031 Right lower quadrant pain: Secondary | ICD-10-CM

## 2021-09-23 DIAGNOSIS — K9041 Non-celiac gluten sensitivity: Secondary | ICD-10-CM | POA: Insufficient documentation

## 2021-09-23 MED ORDER — OMEPRAZOLE 20 MG PO CPDR: 20.0000 mg | DELAYED_RELEASE_CAPSULE | Freq: Every day | ORAL | 3 refills | Status: AC

## 2021-09-23 MED ORDER — ONDANSETRON HCL 4 MG PO TABS: 4.0000 mg | ORAL_TABLET | Freq: Three times a day (TID) | ORAL | 1 refills | Status: AC | PRN

## 2021-09-23 NOTE — Patient Instructions (Addendum)
We will get you set up for a CT scan of your abdomen to further evaluate your right lower quadrant pain.  I have sent omeprazole 20mg  to your pharmacy, this is for acid reflux, you should take it 30-45 minutes prior to breakfast every morning.  Please try to drink at least 64 oz of water throughout the day.  We will try samples of Linzess for your constipation, this can cause some nausea/abdominal upset at first, please try this for 1-2 weeks and call with an update on how your symptoms are doing. We can send a prescription If this works for you, there is a lower dose and a higher one we can also try if needed, I have given you the intermediate dose. You will take this once a day, please take with food in the morning.  It is important that you increase fruits and veggies in your diet and follow a gluten free diet. I will send a few zofran for you to have if absolutely needed, however, be mindful these can worsen your constipation.   Follow up in 3 months

## 2021-09-23 NOTE — Progress Notes (Signed)
Referring Provider: Heather Roberts, NP Primary Care Physician:  Heather Roberts, NP Primary GI Physician: previously Dr. Clinton Sawyer at [facility] Complaint  Patient presents with   Constipation    Patient here today with complaints of Diarrhea ongoing for over a year.She has occasional nausea and vomiting and bloating. Patient was told she had a gluten allergy, but no one instructed her on what to do about it. She was told she had IBS also.   HPI:   Dawn Mcpherson is a 20 y.o. female with past medical history of anxiety, depression, and headaches.  Patient presenting today for chronic constipation, bloating and nausea/vomiting and RLQ pain.  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 She has tried avoiding gluten with good results, however, this is not something she has stuck to consistently. she reports maybe 2 BMs per week. has tried miralax 1-2 capfuls per day for about 2 weeks, as well as dulcolax, however, neither provided much relief so she is currently not taking anything to help with her constipation. States She will sit on the toilet sometimes for an hour and has to strain a lot to have a BM, however, when she does have one, its often small, hard pellets of stool. She has frequent nausea and sometimes has episodes of vomiting which she reports will provide relief of her abdominal bloating/discomfort. She denies rectal bleeding or black stools.   She does endorse RLQ pain x1 year, worse with palpation of the area and worse when lying supine, does have history of ovarian cysts ([place] 08/2020 with L ovarian cyst, previous diagnosis of R ovarian cyst, however, imaging unavailable in EMR), denies fevers. She denies any alleviating factors with RLQ pain.   She eats a diet  high in chicken, rice, low in fruits and veggies. Does not drink much water. She eats once maybe two meals per day, typically eats late in the evenings and goes to bed pretty soon, thereafter.She denies burning in her chest or throat, early satiety or dysphagia, she reports she does feel a thickness in her throat. Hx of gastritis, no current PPI or H2B therapy.   Social hx: no drugs, etoh or tobacco  US Pelvis and trans vag [place] (08/2020): L ovarian cyst Abd xray ([facility]): (07/25/20)non obstructive bowel gas pattern, diffuse colonic gas content Is entirely non specicif in the context of preceding endoscopy. Last Colonoscopy: (Dr. Clinton Sawyer, [facility]) 07/2020, told she has a gluten sensitivity, bening colonic mucosa with lymphoid aggregate, neg for acute colitis Last Endoscopy:07/2020, minimal chronic gastritis, no h pylori, benign squamous mucosa  Past Medical History:  Diagnosis Date   Anxiety    Depression    Depression    [employer]rees[employer] 03/06/2021   Depression    [employer] 04/30/2021   Headache(784.0)    IBS (irritable bowel syndrome)     Past Surgical History:  Procedure Laterality Date   INCISION AND DRAINAGE      Current Outpatient Medications  Medication Sig Dispense Refill   buPROPion (WELLBUTRIN XL) 300 MG 24 hr tablet Take 1 tablet (300 mg total) by mouth daily. 90 tablet 1   ibuprofen (ADVIL) 800 MG tablet Take 1 tablet (800 mg total) by mouth every 8 (eight) hours as needed. 30 tablet 0   ondansetron (ZOFRAN ODT) 4  MG disintegrating tablet Take 1 tablet (4 mg total) by mouth every 8 (eight) hours as needed for nausea or vomiting. 4mg  ODT q4 hours prn nausea/vomit 15 tablet 0   No current facility-administered medications for this visit.    Allergies as of 09/23/2021 - Review Complete 09/23/2021  Allergen Reaction Noted   Gluten meal  05/03/2021    Family History  Problem Relation Age of Onset   Diabetes Father    Healthy Sister    Healthy Sister    Healthy Sister     Social  History   Socioeconomic History   Marital status: Single    Spouse name: Not on file   Number of children: Not on file   Years of education: Not on file   Highest education level: Not on file  Occupational History   Occupation: [employer]    Comment: 05/05/2021  Tobacco Use   Smoking status: Never    Passive exposure: Yes   Smokeless tobacco: Never  Vaping Use   Vaping Use: Never used  Substance and Sexual Activity   Alcohol use: No   Drug use: No   Sexual activity: Never  Other Topics Concern   Not on file  Social History Narrative   Not on file   Social Determinants of Health   Financial Resource Strain: Not on file  Food Insecurity: Not on file  Transportation Needs: Not on file  Physical Activity: Not on file  Stress: Not on file  Social Connections: Not on file   Review of Systems: Gen: Denies fever, chills, anorexia. Denies fatigue, weakness, weight loss.  CV: Denies chest pain, palpitations, syncope, peripheral edema, and claudication. Resp: Denies dyspnea at rest, cough, wheezing, coughing up blood, and pleurisy. HHENT:+thickness in throat GI: Denies rectal bleeding, melena, early satiety, dysphagia, odynophagia. +nausea +constipation +vomiting +RLQ pain +bloating Derm: Denies rash, itching, dry skin Psych: Denies depression, anxiety, memory loss, confusion. No homicidal or suicidal ideation.  Heme: Denies bruising, bleeding, and enlarged lymph nodes.  Physical Exam: BP 112/80 (BP Location: Right Arm, Patient Position: Sitting, Cuff Size: Large)   Pulse 79   Temp 98.1 F (36.7 C) (Oral)   Ht 5\' 3"  (1.6 m)   Wt 210 lb 6.4 oz (95.4 kg)   BMI 37.27 kg/m  General:   Alert and oriented. No distress noted. Pleasant and cooperative.  Head:  Normocephalic and atraumatic. Eyes:  Conjuctiva clear without scleral icterus. Mouth:  Oral mucosa pink and moist. Good dentition. No lesions. Heart: Normal rate and rhythm, s1 and s2 heart sounds present.   Lungs: Clear lung sounds in all lobes. Respirations equal and unlabored. Abdomen:  +BS, soft, non-distended. No rebound or guarding. No HSM or masses noted. Ttp RLQ, negative rovsings and psoas sign Derm: No palmar erythema or jaundice Msk:  Symmetrical without gross deformities. Normal posture. Extremities:  Without edema. Neurologic:  Alert and  oriented x4 Psych:  Alert and cooperative. Normal mood and affect.  Invalid input(s): 6 MONTHS   ASSESSMENT: Dawn Mcpherson is a 20 y.o. female presenting today for further evaluation of her ongoing constipation, bloating, nausea/vomiting and RLQ pain.  Constipation has been a chronic issue for which she has tried miralax and dulcolax for without results, Colonoscopy in August 2021 was unremarkable for etiologic findings that would contribute. We will try Linzess 26 daily, increase water intake as well as fruits and veggies, exercise as tolerated. Samples given.   She reports History of non-celiac gluten sensitivity diagnosed by GI  Doctor at [facility], gluten free diet provided to patient today. Discussed implications of following this diet consistently. No anemia present on most recent labs from 08/30/21.   Patient denies burning in chest or throat, dysphagia, or acid regurgitation, however, she has frequent nausea and a thickness in her throat, she often eats late in the evenings shortly before bed. EGD in aug 2021 showed evidence of chronic gastritis. We will trial PPI therapy to see if her symptoms are related to reflux. If no improvement of symptoms after 1-2 weeks, she can stop PPI. Omeprazole 20mg   once daily sent to pharmacy. 15 zofran 4mg  sent to use PRN, sparingly, patient counseled on worsening constipation with zofran so this should only be used when nausea/vomiting are very bad.   RLQ pain likely related to an ovarian cyst given patient's history of cysts in the past, in Sept 2021 showed Left ovarian cyst, however, she has hx of R ovarian  cysts as well. Not overly concerned for appendicitis given the chronicity of the pain (1 year). Patient to let us know if pain does not improve over the next few weeks and we can further evaluate with CT imaging, however, I will hold off on imaging at this time.    PLAN:  Omeprazole 20mg  daily 30-45 mins before breakfast, stay upright 2-3 hours after eating 2. Linzess 90 mcg samples given, will rx if helpful 3. Zofran 4mg  q8 hours prn, 15 tabs sent, use sparingly as this can worsen diarrhea 4. Gluten free diet information provided, need to follow this 5. Increase fruits/veggies and water, atleast 64 oz a day 6. Consider CT imaging if RLQ pain does not improve over the next few weeks  Follow Up: 3 months  Vanissa Strength L. 09-22-1998, MSN, APRN, AGNP-C Adult-Gerontology Nurse Practitioner [facility]

## 2021-09-24 ENCOUNTER — Other Ambulatory Visit (INDEPENDENT_AMBULATORY_CARE_PROVIDER_SITE_OTHER): Payer: Self-pay

## 2021-09-24 DIAGNOSIS — R1031 Right lower quadrant pain: Secondary | ICD-10-CM

## 2021-09-30 ENCOUNTER — Ambulatory Visit (HOSPITAL_COMMUNITY): Payer: PRIVATE HEALTH INSURANCE

## 2021-10-11 ENCOUNTER — Encounter: Payer: Self-pay | Admitting: Internal Medicine

## 2021-10-11 ENCOUNTER — Ambulatory Visit (INDEPENDENT_AMBULATORY_CARE_PROVIDER_SITE_OTHER): Payer: PRIVATE HEALTH INSURANCE | Admitting: Internal Medicine

## 2021-10-11 ENCOUNTER — Other Ambulatory Visit: Payer: Self-pay

## 2021-10-11 DIAGNOSIS — F411 Generalized anxiety disorder: Secondary | ICD-10-CM | POA: Diagnosis not present

## 2021-10-11 DIAGNOSIS — F322 Major depressive disorder, single episode, severe without psychotic features: Secondary | ICD-10-CM

## 2021-10-11 MED ORDER — HYDROXYZINE PAMOATE 25 MG PO CAPS: 25.0000 mg | ORAL_CAPSULE | Freq: Three times a day (TID) | ORAL | 1 refills | Status: DC | PRN

## 2021-10-11 NOTE — Assessment & Plan Note (Signed)
Persistent symptoms, with some panic spells Started Vistaril PRN Continue Wellbutrin Referred to Psychiatry - [facility] [place] Relaxation techniques material provided

## 2021-10-11 NOTE — Patient Instructions (Signed)
Please take Vistaril as needed for anxiety spells.  Please continue to take Wellbutrin for now.  You are being referred to Psychiatry.

## 2021-10-11 NOTE — Progress Notes (Signed)
Virtual Visit via Telephone Note   This visit type was conducted due to national recommendations for restrictions regarding the COVID-19 Pandemic (e.g. social distancing) in an effort to limit this patient's exposure and mitigate transmission in our community.  Due to her co-morbid illnesses, this patient is at least at moderate risk for complications without adequate follow up.  This format is felt to be most appropriate for this patient at this time.  The patient did not have access to video technology/had technical difficulties with video requiring transitioning to audio format only (telephone).  All issues noted in this document were discussed and addressed.  No physical exam could be performed with this format.  Evaluation Performed:  Follow-up visit  Date:  10/11/2021   ID:  Dawn Mcpherson, DOB [birth year], MRN [id number]  Patient Location: Home Provider Location: Office/Clinic  Participants: Patient Location of Patient: Home Location of Provider: Telehealth Consent was obtain for visit to be over via telehealth. I verified that I am speaking with the correct person using two identifiers.  PCP:  Heather Roberts, NP   Chief Complaint: Anxiety spells  History of Present Illness:    Dawn Mcpherson is a 20 y.o. female who has a televisit for c/o anxiety spells, worse recently. She has had a lot of change in her surrounding - lost a family member, changed her college and her major and has had some physical issues (GI) as well. She has been taking Wellbutrin, but states that it has not been helping much. She has been having palpitations at times. Denies any dyspnea or chest tightness. She has not been able to see Psychiatrist yet. She went to ER for dehydration in the last month, and had IV fluids, but she had palpitations with IV medications at that time, which had resolved later. She still thinks that those medications have affected her. Denies any SI or HI.  The patient does not have  symptoms concerning for COVID-19 infection (fever, chills, cough, or new shortness of breath).   Past Medical, Surgical, Social History, Allergies, and Medications have been Reviewed.  Past Medical History:  Diagnosis Date   Anxiety    Depression    Depression    Phreesia 03/06/2021   Depression    Phreesia 04/30/2021   Headache(784.0)    IBS (irritable bowel syndrome)    Past Surgical History:  Procedure Laterality Date   COLONOSCOPY WITH ESOPHAGOGASTRODUODENOSCOPY (EGD)  07/2020   [facility], Dr. Clinton Sawyer   INCISION AND DRAINAGE       Current Meds  Medication Sig   buPROPion (WELLBUTRIN XL) 300 MG 24 hr tablet Take 1 tablet (300 mg total) by mouth daily.   hydrOXYzine (VISTARIL) 25 MG capsule Take 1 capsule (25 mg total) by mouth every 8 (eight) hours as needed for anxiety.   ibuprofen (ADVIL) 800 MG tablet Take 1 tablet (800 mg total) by mouth every 8 (eight) hours as needed.   omeprazole (PRILOSEC) 20 MG capsule Take 1 capsule (20 mg total) by mouth daily.   ondansetron (ZOFRAN) 4 MG tablet Take 1 tablet (4 mg total) by mouth every 8 (eight) hours as needed for nausea or vomiting.     Allergies:   Gluten meal   ROS:   Please see the history of present illness.     All other systems reviewed and are negative.   Labs/Other Tests and Data Reviewed:    Recent Labs: 04/30/2021: TSH 2.080 08/30/2021: ALT 23; BUN 10; Creatinine, Ser 0.74;  Hemoglobin 14.8; Platelets 393; Potassium 4.1; Sodium 140   Recent Lipid Panel Lab Results  Component Value Date/Time   CHOL 156 04/30/2021 09:59 AM   TRIG 52 04/30/2021 09:59 AM   HDL 41 04/30/2021 09:59 AM   CHOLHDL 3.8 09/20/2014 07:10 AM   LDLCALC 104 04/30/2021 09:59 AM    Wt Readings from Last 3 Encounters:  09/23/21 210 lb 6.4 oz (95.4 kg) (98 %, Z= 2.09)*  06/18/21 209 lb (94.8 kg) (98 %, Z= 2.07)*  05/03/21 208 lb (94.3 kg) (98 %, Z= 2.06)*   * Growth percentiles are based on CDC (Girls, 2-20 Years) data.      ASSESSMENT & PLAN:    Generalized anxiety disorder Persistent symptoms, with some panic spells Started Vistaril PRN Continue Wellbutrin Referred to Psychiatry - [facility] [place] provided  MDD (major depressive disorder), single episode, severe , no psychosis Flowsheet Row Office Visit from 10/11/2021 in [facility]  PHQ-9 Total Score 10      On Wellbutrin Has lots of adjustment issues as well recently Referred to Psychiatry   Time:   Today, I have spent 18 minutes reviewing the chart, including problem list, medications, and with the patient with telehealth technology discussing the above problems.   Medication Adjustments/Labs and Tests Ordered: Current medicines are reviewed at length with the patient today.  Concerns regarding medicines are outlined above.   Tests Ordered: Orders Placed This Encounter  Procedures   Ambulatory referral to Psychiatry    Medication Changes: Meds ordered this encounter  Medications   hydrOXYzine (VISTARIL) 25 MG capsule    Sig: Take 1 capsule (25 mg total) by mouth every 8 (eight) hours as needed for anxiety.    Dispense:  60 capsule    Refill:  1     Note: This dictation was prepared with Dragon dictation along with smaller phrase technology. Similar sounding words can be transcribed inadequately or may not be corrected upon review. Any transcriptional errors that result from this process are unintentional.      Disposition:  Follow up  Signed, Anabel Halon, MD  10/11/2021 10:24 AM     [facility]

## 2021-10-11 NOTE — Assessment & Plan Note (Signed)
Flowsheet Row Office Visit from 10/11/2021 in [facility]  PHQ-9 Total Score 10     On Wellbutrin Has lots of adjustment issues as well recently Referred to Psychiatry

## 2021-10-16 ENCOUNTER — Telehealth: Payer: Self-pay | Admitting: Nurse Practitioner

## 2021-10-16 NOTE — Telephone Encounter (Signed)
Pts mom called in about Dawn Mcpherson anxiety.  Was sent in a new med on last appt 10/21  Meds are not helping wants to know if she can get something stronger . Also is having racing heart and wants to know if its linked to anxiety. Has disney trip coming up and wants to get anxiety under control.

## 2021-10-17 NOTE — Telephone Encounter (Signed)
LVM for pt to call the office.

## 2021-10-17 NOTE — Telephone Encounter (Signed)
Pt returned phone call.  

## 2021-10-17 NOTE — Telephone Encounter (Signed)
Let pt know this would have to come from either psychiatrist or [facility]. She had not heard anything from last psych referral can you reach out the last provider that referral was sent to and get her set up with an appt?

## 2021-11-04 ENCOUNTER — Emergency Department (HOSPITAL_BASED_OUTPATIENT_CLINIC_OR_DEPARTMENT_OTHER): Payer: PRIVATE HEALTH INSURANCE | Admitting: Radiology

## 2021-11-04 ENCOUNTER — Other Ambulatory Visit: Payer: Self-pay

## 2021-11-04 ENCOUNTER — Emergency Department (HOSPITAL_BASED_OUTPATIENT_CLINIC_OR_DEPARTMENT_OTHER)
Admission: EM | Admit: 2021-11-04 | Discharge: 2021-11-04 | Disposition: A | Discharge status: Home / Self Care | Payer: PRIVATE HEALTH INSURANCE | Attending: Emergency Medicine | Admitting: Emergency Medicine

## 2021-11-04 ENCOUNTER — Encounter (HOSPITAL_BASED_OUTPATIENT_CLINIC_OR_DEPARTMENT_OTHER): Payer: Self-pay | Admitting: Obstetrics and Gynecology

## 2021-11-04 DIAGNOSIS — R079 Chest pain, unspecified: Secondary | ICD-10-CM

## 2021-11-04 DIAGNOSIS — F419 Anxiety disorder, unspecified: Secondary | ICD-10-CM | POA: Diagnosis not present

## 2021-11-04 DIAGNOSIS — R0602 Shortness of breath: Secondary | ICD-10-CM | POA: Insufficient documentation

## 2021-11-04 DIAGNOSIS — R072 Precordial pain: Secondary | ICD-10-CM | POA: Diagnosis not present

## 2021-11-04 LAB — CBC
HCT: 40.4 % (ref 36.0–46.0)
Hemoglobin: 13.8 g/dL (ref 12.0–15.0)
MCH: 27.9 pg (ref 26.0–34.0)
MCHC: 34.2 g/dL (ref 30.0–36.0)
MCV: 81.6 fL (ref 80.0–100.0)
Platelets: 397 10*3/uL (ref 150–400)
RBC: 4.95 MIL/uL (ref 3.87–5.11)
RDW: 11.9 % (ref 11.5–15.5)
WBC: 12.1 10*3/uL — ABNORMAL HIGH (ref 4.0–10.5)
nRBC: 0 % (ref 0.0–0.2)

## 2021-11-04 LAB — BASIC METABOLIC PANEL
Anion gap: 8 (ref 5–15)
BUN: 10 mg/dL (ref 6–20)
CO2: 28 mmol/L (ref 22–32)
Calcium: 9.3 mg/dL (ref 8.9–10.3)
Chloride: 103 mmol/L (ref 98–111)
Creatinine, Ser: 0.91 mg/dL (ref 0.44–1.00)
GFR, Estimated: >60 mL/min (ref >60)
Glucose, Bld: 97 mg/dL (ref 70–99)
Potassium: 3.7 mmol/L (ref 3.5–5.1)
Sodium: 139 mmol/L (ref 135–145)

## 2021-11-04 LAB — HEPATIC FUNCTION PANEL
ALT: 14 U/L (ref 0–44)
AST: 12 U/L — ABNORMAL LOW (ref 15–41)
Albumin: 4.6 g/dL (ref 3.5–5.0)
Alkaline Phosphatase: 46 U/L (ref 38–126)
Bilirubin, Direct: 0.2 mg/dL (ref 0.0–0.2)
Indirect Bilirubin: 0.5 mg/dL (ref 0.3–0.9)
Total Bilirubin: 0.7 mg/dL (ref 0.3–1.2)
Total Protein: 7.2 g/dL (ref 6.5–8.1)

## 2021-11-04 LAB — MAGNESIUM: Magnesium: 2 mg/dL (ref 1.7–2.4)

## 2021-11-04 LAB — PREGNANCY, URINE: Preg Test, Ur: NEGATIVE

## 2021-11-04 LAB — D-DIMER, QUANTITATIVE: D-Dimer, Quant: <0.27 ug/mL-FEU (ref 0.00–0.50)

## 2021-11-04 LAB — TROPONIN I (HIGH SENSITIVITY)
Troponin I (High Sensitivity): <2 ng/L (ref <18)
Troponin I (High Sensitivity): <2 ng/L (ref <18)

## 2021-11-04 NOTE — ED Provider Notes (Signed)
[facility] EMERGENCY DEPT Provider Note   CSN: [id number] Arrival date & time: 11/04/21  1624     History Chief Complaint  Patient presents with   Chest Pain   Nausea   Emesis    Dawn Mcpherson is a 20 y.o. female.  The history is provided by the patient, a parent and medical records. No language interpreter was used.  Chest Pain Pain location:  Substernal area, L chest and R chest Pain quality: aching, pressure, sharp and tightness   Pain radiates to:  Does not radiate Pain severity:  Severe Onset quality:  Gradual Duration:  1 month Timing:  Sporadic Progression:  Waxing and waning Relieved by:  Nothing Worsened by:  Deep breathing Ineffective treatments:  None tried Associated symptoms: anxiety, fatigue, nausea, palpitations, shortness of breath and vomiting   Associated symptoms: no abdominal pain, no altered mental status, no back pain, no claudication, no cough, no diaphoresis, no fever, no headache, no lower extremity edema, no near-syncope, no numbness and no weakness   Emesis Associated symptoms: no abdominal pain, no chills, no cough, no diarrhea, no fever and no headaches       Past Medical History:  Diagnosis Date   Anxiety    Depression    Depression    Phreesia 03/06/2021   Depression    Phreesia 04/30/2021   Headache(784.0)    IBS (irritable bowel syndrome)     Patient Active Problem List   Diagnosis Date Noted   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   Right lower quadrant abdominal pain 09/23/2021   Nausea and vomiting 09/23/2021   Left foot pain 06/18/2021   Acute serous otitis media of left ear without rupture 05/03/2021   Constipation 05/03/2021   Encounter to establish care 03/06/2021   ADHD 03/06/2021   Weight gain, abnormal 03/06/2021   Hidradenitis suppurativa of right axilla 03/06/2021   MDD (major depressive disorder), single episode, severe , no psychosis (HCC) 09/20/2014   Generalized anxiety disorder  09/20/2014    Past Surgical History:  Procedure Laterality Date   COLONOSCOPY WITH ESOPHAGOGASTRODUODENOSCOPY (EGD)  07/2020   [facility], Dr. Clinton Sawyer   INCISION AND DRAINAGE       OB History     Gravida  0   Para  0   Term  0   Preterm  0   AB  0   Living  0      SAB  0   IAB  0   Ectopic  0   Multiple  0   Live Births  0           Family History  Problem Relation Age of Onset   Diabetes Father    Healthy Sister    Healthy Sister    Healthy Sister     Social History   Tobacco Use   Smoking status: Never    Passive exposure: Yes   Smokeless tobacco: Never  Vaping Use   Vaping Use: Never used  Substance Use Topics   Alcohol use: No   Drug use: No    Home Medications Prior to Admission medications   Medication Sig Start Date End Date Taking? Authorizing Provider  buPROPion (WELLBUTRIN XL) 300 MG 24 hr tablet Take 1 tablet (300 mg total) by mouth daily. 07/23/21   Heather Roberts, NP  hydrOXYzine (VISTARIL) 25 MG capsule Take 1 capsule (25 mg total) by mouth every 8 (eight) hours as needed for anxiety. 10/11/21   Anabel Halon, MD  ibuprofen (ADVIL) 800 MG tablet Take 1 tablet (800 mg total) by mouth every 8 (eight) hours as needed. 06/18/21   Noreene Larsson, NP  omeprazole (PRILOSEC) 20 MG capsule Take 1 capsule (20 mg total) by mouth daily. 09/23/21   Carlan, Chelsea L, NP  ondansetron (ZOFRAN) 4 MG tablet Take 1 tablet (4 mg total) by mouth every 8 (eight) hours as needed for nausea or vomiting. 09/23/21   Carlan, Deatra Robinson, NP    Allergies    Gluten meal and Reglan [metoclopramide]  Review of Systems   Review of Systems  Constitutional:  Positive for fatigue. Negative for chills, diaphoresis and fever.  HENT:  Negative for congestion.   Respiratory:  Positive for chest tightness and shortness of breath. Negative for cough and wheezing.   Cardiovascular:  Positive for chest pain and palpitations. Negative for claudication, leg swelling and  near-syncope.  Gastrointestinal:  Positive for nausea and vomiting. Negative for abdominal pain, constipation and diarrhea.  Genitourinary:  Negative for dysuria, flank pain and frequency.  Musculoskeletal:  Negative for back pain, neck pain and neck stiffness.  Skin:  Negative for rash and wound.  Neurological:  Negative for weakness, light-headedness, numbness and headaches.  Psychiatric/Behavioral:  Negative for agitation and confusion.   All other systems reviewed and are negative.  Physical Exam Updated Vital Signs BP 133/76 (BP Location: Left Arm)   Pulse 81   Temp 98.2 F (36.8 C) (Oral)   Resp 18   Ht 5\' 3"  (1.6 m)   Wt 96.2 kg   LMP 10/11/2021 (Exact Date)   SpO2 100%   BMI 37.55 kg/m   Physical Exam Vitals and nursing note reviewed.  Constitutional:      General: She is not in acute distress.    Appearance: She is well-developed.  HENT:     Head: Normocephalic and atraumatic.  Eyes:     Conjunctiva/sclera: Conjunctivae normal.  Cardiovascular:     Rate and Rhythm: Normal rate and regular rhythm.     Heart sounds: No murmur heard. Pulmonary:     Effort: Pulmonary effort is normal. No respiratory distress.     Breath sounds: Normal breath sounds.  Abdominal:     Palpations: Abdomen is soft.     Tenderness: There is no abdominal tenderness.  Musculoskeletal:     Cervical back: Neck supple.  Skin:    General: Skin is warm and dry.  Neurological:     Mental Status: She is alert.    ED Results / Procedures / Treatments   Labs (all labs ordered are listed, but only abnormal results are displayed) Labs Reviewed  CBC - Abnormal; Notable for the following components:      Result Value   WBC 12.1 (*)    All other components within normal limits  HEPATIC FUNCTION PANEL - Abnormal; Notable for the following components:   AST 12 (*)    All other components within normal limits  BASIC METABOLIC PANEL  PREGNANCY, URINE  D-DIMER, QUANTITATIVE  MAGNESIUM  TSH   TROPONIN I (HIGH SENSITIVITY)  TROPONIN I (HIGH SENSITIVITY)    EKG EKG Interpretation  Date/Time:  Monday November 04 2021 16:39:03 EST Ventricular Rate:  94 PR Interval:  132 QRS Duration: 88 QT Interval:  360 QTC Calculation: 450 R Axis:   78 Text Interpretation: Normal sinus rhythm Nonspecific ST abnormality Abnormal ECG When compared to prior, similar appearance. No STEMI Confirmed by Antony Blackbird [phone]) on 11/04/2021 5:56:38 PM  Radiology DG Chest  2 View  Result Date: 11/04/2021 CLINICAL DATA:  Chest pain. EXAM: CHEST - 2 VIEW COMPARISON:  None. FINDINGS: The heart size and mediastinal contours are within normal limits. Both lungs are clear. The visualized skeletal structures are unremarkable. IMPRESSION: No active cardiopulmonary disease. Electronically Signed   By: Ronney Asters M.D.   On: 11/04/2021 17:02    Procedures Procedures   Medications Ordered in ED Medications - No data to display  ED Course  I have reviewed the triage vital signs and the nursing notes.  Pertinent labs & imaging results that were available during my care of the patient were reviewed by me and considered in my medical decision making (see chart for details).    MDM Rules/Calculators/A&P                           Dawn Mcpherson is a 20 y.o. female with a past medical history significant for anxiety, depression, and IBS who presents with 1 month of intermittent chest discomfort, shortness of breath, nausea, and vomiting.  Patient reports that symptoms come and go and she also feels palpitations at times.  She reports no fevers, chills, congestion, or cough.  Denies any sick contacts.  Denies any trauma.  Reports the pain is associate with palpitations and comes and goes.  Patient is anxious about it.  She denies any new leg pain or leg swelling.  Denies any nausea, vomiting, constipation, or diarrhea.  On exam, lungs clear and chest was slightly tender to palpation.  Abdomen nontender.  No  murmur.  Good symmetric pulses in extremities.  Patient well-appearing and had reassuring vital signs on arrival.  EKG did not show STEMI.  Patient's chest pain does sound atypical in nature however given the extent of her symptoms we did feel is reasonable to do a work-up today.  Patient had a D-dimer that was negative.  TSH was normal.  Troponin negative x2.  Magnesium normal.  CBC and CMP reassuring.  Mild leukocytosis but otherwise no anemia.  Pregnancy test negative.  Chest x-ray reassuring.  Patient was monitored for over 5 hours without any concerning abnormalities on telemetry or any new complaints.  Given her palpitation symptoms and the chest pain that comes and goes, did feel is reasonable to have her follow-up with cardiology for possible monitoring.  Patient agreed to plan of care and with follow-up.  She no other questions or concerns and was discharged in good condition after reassuring work-up.         Final Clinical Impression(s) / ED Diagnoses Final diagnoses:  Chest pain, unspecified type    Rx / DC Orders ED Discharge Orders     None      Clinical Impression: 1. Chest pain, unspecified type     Disposition: Discharge  Condition: Good  I have discussed the results, Dx and Tx plan with the pt(& family if present). He/she/they expressed understanding and agree(s) with the plan. Discharge instructions discussed at great length. Strict return precautions discussed and pt &/or family have verbalized understanding of the instructions. No further questions at time of discharge.    Discharge Medication List as of 11/04/2021  9:54 PM      Follow Up: [place] [phone] [facility] [place] [phone]    Noreene Larsson, NP [address]  [address]  [address] [place] [address] [phone]        Leonda Cristo, Gwenyth Allegra, MD 11/05/21 [phone]

## 2021-11-04 NOTE — ED Triage Notes (Signed)
Patient reports over the past month she has been having increasingly bad chest pain, shortness of breath, nausea and emesis.

## 2021-11-04 NOTE — ED Notes (Signed)
Pt discharged to home. Discharge instructions have been discussed with patient and/or family members. Pt verbally acknowledges understanding d/c instructions, and endorses comprehension to checkout at registration before leaving.  °

## 2021-11-04 NOTE — ED Notes (Addendum)
First contact with patient. Patient arrived via triage from home with complaints of palpitations/chest pain/ n/v and shortness of breath x 1 month - intermittently worse . Pt is A&OX 4. Respirations even/unlabored- speaking in full sentences. Patient changed into gown and placed on cardiac monitor and call light within reach. Patient updated on plan of care. Will continue to monitor patient.   1911: Report given to [facility] RN for continuation of care. No acute distress noted upon this RN's departure of Patient.

## 2021-11-04 NOTE — Discharge Instructions (Signed)
Your work-up today was overall reassuring.  You are cardiac enzymes were negative both times we checked them and your blood clot test was also negative.  The chest x-ray is reassuring and overall we feel you are safe for discharge home however given your symptoms you been having, it is reasonable to follow-up with cardiology for further monitoring and management.  If any symptoms change or worsen acutely, please return to the nearest emergency department.

## 2021-11-05 ENCOUNTER — Telehealth: Payer: Self-pay

## 2021-11-05 LAB — TSH: TSH: 2.33 u[IU]/mL (ref 0.350–4.500)

## 2021-11-05 NOTE — Telephone Encounter (Signed)
Patient mother called back no longer needed, has cardiologist appt Monday 11/21.

## 2021-11-05 NOTE — Telephone Encounter (Signed)
Dawn Mcpherson mother called said Dawn Mcpherson went to ER last night chest pain and referred her out to a cardiologist and needs to know which cardiologist would our practice refer out to.  Please contact Darl Pikes back # [phone]. Dawn Mcpherson will keep her appt for tomorrow with Dr Lodema Hong.

## 2021-11-05 NOTE — Telephone Encounter (Signed)
noted 

## 2021-11-06 ENCOUNTER — Ambulatory Visit: Payer: PRIVATE HEALTH INSURANCE | Admitting: Family Medicine

## 2021-11-11 ENCOUNTER — Ambulatory Visit (INDEPENDENT_AMBULATORY_CARE_PROVIDER_SITE_OTHER): Payer: PRIVATE HEALTH INSURANCE | Admitting: Internal Medicine

## 2021-11-11 ENCOUNTER — Telehealth: Payer: Self-pay | Admitting: Internal Medicine

## 2021-11-11 ENCOUNTER — Ambulatory Visit (INDEPENDENT_AMBULATORY_CARE_PROVIDER_SITE_OTHER): Payer: PRIVATE HEALTH INSURANCE

## 2021-11-11 ENCOUNTER — Other Ambulatory Visit: Payer: Self-pay

## 2021-11-11 VITALS — BP 124/86 | HR 76 | Ht 63.0 in | Wt 209.4 lb

## 2021-11-11 DIAGNOSIS — R002 Palpitations: Secondary | ICD-10-CM | POA: Diagnosis not present

## 2021-11-11 NOTE — Telephone Encounter (Signed)
Called patient back- she states she would like to know if we can test for POTS, she meant to mention this to Dr.Branch earlier, but forgot.   I advised I would send a message to Dr.Branch to see if she thinks this is something she may have and if there was anything different she would like to order.   Patient verbalized understanding.

## 2021-11-11 NOTE — Progress Notes (Signed)
Cardiology Office Note:    Date:  11/11/2021   ID:  Dawn Mcpherson, DOB 10/07/2001, MRN [id number]  PCP:  Heather Roberts, NP   [facility] Providers Cardiologist:  None     Referring MD: Heather Roberts, NP   No chief complaint on file. Atypical chest pain  History of Present Illness:    Dawn Mcpherson is a 20 y.o. female with a hx of anxiety, depression, IBS referral for atypical chest pain from the ED 11/04/2021  She presented to the ED 11/04/2021 with 1 month of intermittent chest discomfort, shortness of breath, nausea and vomiting.  Associated with palpitations and comes and goes. She is anxious.EKG did not show ischemia. Troponin was negative. TSH was normal. Tele was unremarkable. Not pregnant. She's currently taking hydroxyzine and zoloft.   Today,  she notes that with standing, it feels like her heart is racing. She feels her feet and fingers are cold. She feels dizzy at times. Her smart watch can go up 120s-130s per patients. She felt like her legs were shaking. She feels pre-syncopal. She is pan positive for symptoms. She notes pain all over.  She did not report syncope. She drinks 1 soda per day.  No syncopal episodes. Here with her mother.  Social Hx: No smoking, no alcohol. No drug use. Lives with her father  Family History: Mother has hypertension. Father has PAD. No SCD  Orthostatics nl. No persistent tachycardia   Past Medical History:  Diagnosis Date   Anxiety    Depression    Depression    Phreesia 03/06/2021   Depression    Phreesia 04/30/2021   Headache(784.0)    IBS (irritable bowel syndrome)     Past Surgical History:  Procedure Laterality Date   COLONOSCOPY WITH ESOPHAGOGASTRODUODENOSCOPY (EGD)  07/2020   [facility], Dr. Clinton Sawyer   INCISION AND DRAINAGE      Current Medications: No outpatient medications have been marked as taking for the 11/11/21 encounter (Appointment) with Maisie Fus, MD.     Allergies:   Gluten meal and Reglan  [metoclopramide]   Social History   Socioeconomic History   Marital status: Single    Spouse name: Not on file   Number of children: Not on file   Years of education: Not on file   Highest education level: Not on file  Occupational History   Occupation: [employer]    Comment: Programme researcher, broadcasting/film/video  Tobacco Use   Smoking status: Never    Passive exposure: Yes   Smokeless tobacco: Never  Vaping Use   Vaping Use: Never used  Substance and Sexual Activity   Alcohol use: No   Drug use: No   Sexual activity: Yes    Birth control/protection: None  Other Topics Concern   Not on file  Social History Narrative   Not on file   Social Determinants of Health   Financial Resource Strain: Not on file  Food Insecurity: Not on file  Transportation Needs: Not on file  Physical Activity: Not on file  Stress: Not on file  Social Connections: Not on file     Family History: The patient's family history includes Diabetes in her father; Healthy in her sister, sister, and sister.  ROS:   Please see the history of present illness.     All other systems reviewed and are negative.  EKGs/Labs/Other Studies Reviewed:    The following studies were reviewed today:   EKG:  EKG is  ordered today.  The  ekg ordered today demonstrates   NSR, no ischemic ST-T changes, no pre-excitation, Qtc 425 ms  Recent Labs: 11/04/2021: ALT 14; BUN 10; Creatinine, Ser 0.91; Hemoglobin 13.8; Magnesium 2.0; Platelets 397; Potassium 3.7; Sodium 139; TSH 2.330  Recent Lipid Panel    Component Value Date/Time   CHOL 156 04/30/2021 0959   TRIG 52 04/30/2021 0959   HDL 41 04/30/2021 0959   CHOLHDL 3.8 09/20/2014 0710   VLDL 10 09/20/2014 0710   LDLCALC 104 04/30/2021 0959     Risk Assessment/Calculations:           Physical Exam:    VS:   Vitals:   11/11/21 1441  BP: 124/86  Pulse: 76  SpO2: 98%     Wt Readings from Last 3 Encounters:  11/04/21 212 lb (96.2 kg)  09/23/21 210 lb 6.4 oz  (95.4 kg) (98 %, Z= 2.09)*  06/18/21 209 lb (94.8 kg) (98 %, Z= 2.07)*   * Growth percentiles are based on CDC (Girls, 2-20 Years) data.     GEN:  Well nourished, well developed in no acute distress HEENT: Normal NECK: No JVD; No carotid bruits LYMPHATICS: No lymphadenopathy CARDIAC: RRR, no murmurs, rubs, gallops RESPIRATORY:  Clear to auscultation without rales, wheezing or rhonchi  ABDOMEN: Soft, non-tender, non-distended MUSCULOSKELETAL:  No edema; No deformity  SKIN: Warm and dry NEUROLOGIC:  Alert and oriented x 3 PSYCHIATRIC:  Normal affect   ASSESSMENT:    #Atypical Chest pain: Her chest pain is atypical and she is very low risk for coronary disease.  In terms of her palpitations, she does not have high risk features including syncope c/f arrhythmia , family hx of SCD, or abnormalities on her EKG. TSH is normal. Can monitor for 7 days. If normal will see on an as needed basis  PLAN:    In order of problems listed above:  Cardiac event monitor 7 days Follow up PRN      Medication Adjustments/Labs and Tests Ordered: Current medicines are reviewed at length with the patient today.  Concerns regarding medicines are outlined above.   Signed, Maisie Fus, MD  11/11/2021 12:34 PM    [facility]

## 2021-11-11 NOTE — Patient Instructions (Signed)
Medication Instructions:  Continue same medications   Lab Work: None ordered   Testing/Procedures: 7 day Zio Monitor   Follow-Up: At [facility], you and your health needs are our priority.  As part of our continuing mission to provide you with exceptional heart care, we have created designated Provider Care Teams.  These Care Teams include your primary Cardiologist (physician) and Advanced Practice Providers (APPs -  Physician Assistants and Nurse Practitioners) who all work together to provide you with the care you need, when you need it.  We recommend signing up for the patient portal called "MyChart".  Sign up information is provided on this After Visit Summary.  MyChart is used to connect with patients for Virtual Visits (Telemedicine).  Patients are able to view lab/test results, encounter notes, upcoming appointments, etc.  Non-urgent messages can be sent to your provider as well.   To learn more about what you can do with MyChart, go to [email or web address].      Your next appointment:  As Needed    The format for your next appointment: Office   Provider:  Dr.Branch

## 2021-11-11 NOTE — Telephone Encounter (Signed)
Pt is wanting to know if there is a way she is able to be tested for POTS as this is a concern for her... please advise

## 2021-11-11 NOTE — Progress Notes (Unsigned)
Enrolled for [employer] to mail a ZIO XT long term holter monitor to the patients address on file.  

## 2021-11-12 ENCOUNTER — Encounter: Payer: Self-pay | Admitting: Internal Medicine

## 2021-11-13 DIAGNOSIS — R002 Palpitations: Secondary | ICD-10-CM | POA: Diagnosis not present

## 2021-11-21 ENCOUNTER — Other Ambulatory Visit: Payer: Self-pay | Admitting: Internal Medicine

## 2021-11-21 DIAGNOSIS — F411 Generalized anxiety disorder: Secondary | ICD-10-CM

## 2021-11-25 NOTE — Telephone Encounter (Signed)
Please see MyChart message. Will close this encounter.

## 2021-11-28 ENCOUNTER — Telehealth: Payer: Self-pay | Admitting: Internal Medicine

## 2021-11-28 NOTE — Telephone Encounter (Signed)
Returned call to pt she states that she would like results from the monitor. She states "I can see them in MyChart, can you just tell me what they say"? Informed pt that we have not received a message from Dr Wyline Mood so, we will call you when we have these results. Verbalized understanding.

## 2021-11-28 NOTE — Telephone Encounter (Signed)
Pt is reaching out for next steps after Zio monitor.. please advise

## 2021-11-29 ENCOUNTER — Encounter: Payer: Self-pay | Admitting: Internal Medicine

## 2021-11-29 ENCOUNTER — Encounter: Payer: Self-pay | Admitting: Cardiology

## 2021-11-29 ENCOUNTER — Other Ambulatory Visit: Payer: Self-pay | Admitting: Internal Medicine

## 2021-11-29 DIAGNOSIS — I471 Supraventricular tachycardia: Secondary | ICD-10-CM

## 2021-11-29 MED ORDER — METOPROLOL TARTRATE 25 MG PO TABS
12.5000 mg | ORAL_TABLET | Freq: Two times a day (BID) | ORAL | 3 refills | Status: DC
Start: 2021-11-29 — End: 2022-04-14

## 2021-11-29 NOTE — Telephone Encounter (Signed)
error 

## 2021-12-02 ENCOUNTER — Other Ambulatory Visit: Payer: Self-pay

## 2021-12-02 ENCOUNTER — Ambulatory Visit (INDEPENDENT_AMBULATORY_CARE_PROVIDER_SITE_OTHER): Payer: Medicaid Other | Admitting: Internal Medicine

## 2021-12-02 ENCOUNTER — Encounter: Payer: Self-pay | Admitting: Internal Medicine

## 2021-12-02 VITALS — BP 119/81 | HR 69 | Resp 17 | Ht 63.0 in | Wt 212.0 lb

## 2021-12-02 DIAGNOSIS — R079 Chest pain, unspecified: Secondary | ICD-10-CM | POA: Insufficient documentation

## 2021-12-02 DIAGNOSIS — I471 Supraventricular tachycardia, unspecified: Secondary | ICD-10-CM | POA: Insufficient documentation

## 2021-12-02 NOTE — Progress Notes (Signed)
Acute Office Visit  Subjective:    Patient ID: Dawn Mcpherson, female    DOB: 10/25/2001, 20 y.o.   MRN: [id number]  Chief Complaint  Patient presents with   Follow-up    Cardiologist referral to [facility] in [facility].    HPI Patient is in today for c/o chest pain and palpitations, which is intermittent and is not related to activity.  Mother is present during the visit.  She has had such episodes while sitting, standing and walking in grocery stores as well.  She has had cardiology eval at [facility] and had a 7 day cardiac monitor, which showed PSVT.  She was placed on metoprolol, but has not helped her much.  Her heart rate has ranged from 44-180s at home, detected by her apple watch.  She also reports chronic, intermittent left shoulder and upper back pain.  She also complains of intermittent numbness of the UE and LE.  Of note, she takes Zoloft and as needed Vistaril for anxiety.  Chart review suggests EKG was WNL.  Last BMP was WNL.  TSH was WNL.  She admits that she has poor p.o. fluid intake and agrees to improve it.  Past Medical History:  Diagnosis Date   Anxiety    Depression    Depression    Phreesia 03/06/2021   Depression    Phreesia 04/30/2021   Headache(784.0)    IBS (irritable bowel syndrome)     Past Surgical History:  Procedure Laterality Date   COLONOSCOPY WITH ESOPHAGOGASTRODUODENOSCOPY (EGD)  07/2020   [facility], Dr. Clinton Sawyer   INCISION AND DRAINAGE      Family History  Problem Relation Age of Onset   Diabetes Father    Healthy Sister    Healthy Sister    Healthy Sister     Social History   Socioeconomic History   Marital status: Single    Spouse name: Not on file   Number of children: Not on file   Years of education: Not on file   Highest education level: Not on file  Occupational History   Occupation: [employer]    Comment: Programme researcher, broadcasting/film/video  Tobacco Use   Smoking status: Never    Passive exposure: Yes   Smokeless  tobacco: Never  Vaping Use   Vaping Use: Never used  Substance and Sexual Activity   Alcohol use: No   Drug use: No   Sexual activity: Yes    Birth control/protection: None  Other Topics Concern   Not on file  Social History Narrative   Not on file   Social Determinants of Health   Financial Resource Strain: Not on file  Food Insecurity: Not on file  Transportation Needs: Not on file  Physical Activity: Not on file  Stress: Not on file  Social Connections: Not on file  Intimate Partner Violence: Not on file    Outpatient Medications Prior to Visit  Medication Sig Dispense Refill   hydrOXYzine (VISTARIL) 25 MG capsule TAKE 1 CAPSULE EVERY 8 HOURS AS NEEDED FOR ANXIETY 60 capsule 1   metoprolol tartrate (LOPRESSOR) 25 MG tablet Take 0.5 tablets (12.5 mg total) by mouth 2 (two) times daily. 30 tablet 3   omeprazole (PRILOSEC) 20 MG capsule Take 1 capsule (20 mg total) by mouth daily. 90 capsule 3   ondansetron (ZOFRAN) 4 MG tablet Take 1 tablet (4 mg total) by mouth every 8 (eight) hours as needed for nausea or vomiting. 15 tablet 1   sertraline (ZOLOFT)  50 MG tablet Take 50 mg by mouth daily.     No facility-administered medications prior to visit.    Allergies  Allergen Reactions   Gluten Meal    Reglan [Metoclopramide] Palpitations    Review of Systems  Constitutional:  Positive for fatigue. Negative for chills and fever.  HENT:  Negative for congestion, sinus pressure and sinus pain.   Respiratory:  Positive for shortness of breath. Negative for cough.   Cardiovascular:  Positive for chest pain and palpitations.  Gastrointestinal:  Negative for diarrhea and vomiting.  Genitourinary:  Negative for dysuria and hematuria.  Musculoskeletal:  Positive for back pain. Negative for neck pain and neck stiffness.  Skin:  Negative for rash.  Neurological:  Positive for dizziness. Negative for weakness.  Psychiatric/Behavioral:  Negative for behavioral problems. The patient is  nervous/anxious.       Objective:    Physical Exam Vitals reviewed.  Constitutional:      General: She is not in acute distress.    Appearance: She is not diaphoretic.  HENT:     Head: Normocephalic and atraumatic.     Mouth/Throat:     Mouth: Mucous membranes are dry.  Eyes:     General: No scleral icterus.    Extraocular Movements: Extraocular movements intact.  Cardiovascular:     Rate and Rhythm: Normal rate and regular rhythm.     Pulses: Normal pulses.     Heart sounds: Normal heart sounds. No murmur heard. Pulmonary:     Breath sounds: Normal breath sounds. No wheezing or rales.  Musculoskeletal:     Cervical back: Neck supple. No tenderness.     Right lower leg: No edema.     Left lower leg: No edema.  Neurological:     General: No focal deficit present.     Mental Status: She is alert and oriented to person, place, and time.  Psychiatric:        Mood and Affect: Mood normal.        Behavior: Behavior normal.    BP 119/81   Pulse 69   Resp 17   Ht 5\' 3"  (1.6 m)   Wt 212 lb 0.6 oz (96.2 kg)   SpO2 98%   BMI 37.56 kg/m  Wt Readings from Last 3 Encounters:  12/02/21 212 lb 0.6 oz (96.2 kg)  11/11/21 209 lb 6.4 oz (95 kg)  11/04/21 212 lb (96.2 kg)        Assessment & Plan:   Problem List Items Addressed This Visit       Cardiovascular and Mediastinum   PSVT (paroxysmal supraventricular tachycardia) ([place]) - Primary    Seen on cardiac monitor Takes Metoprolol Wide variation in HR at home Referred to Dr. Tomie China for second opinion, as per patient request.  She needs to improve hydration as well, may help with palpitations.      Relevant Orders   Ambulatory referral to Cardiology     Other   Chest pain    Atypical chest pain EKG reviewed from the chart-sinus rhythm without any signs of active ischemia. Referred to cardiology        No orders of the defined types were placed in this encounter.    Lindell Spar, MD

## 2021-12-02 NOTE — Assessment & Plan Note (Addendum)
Seen on cardiac monitor Takes Metoprolol Wide variation in HR at home Referred to Dr. Sherren Kerns for second opinion, as per patient request.  She needs to improve hydration as well, may help with palpitations.

## 2021-12-02 NOTE — Assessment & Plan Note (Signed)
Atypical chest pain EKG reviewed from the chart-sinus rhythm without any signs of active ischemia. Referred to cardiology

## 2021-12-02 NOTE — Patient Instructions (Signed)
Please continue taking medications as prescribed.  Please take at least 64 ounces of fluid in a day.  You are being referred to Cardiologist.

## 2021-12-03 ENCOUNTER — Ambulatory Visit: Payer: PRIVATE HEALTH INSURANCE | Admitting: Nurse Practitioner

## 2021-12-11 ENCOUNTER — Ambulatory Visit: Payer: PRIVATE HEALTH INSURANCE | Admitting: Nurse Practitioner

## 2021-12-19 ENCOUNTER — Ambulatory Visit: Payer: Medicaid Other

## 2021-12-19 ENCOUNTER — Encounter: Payer: Self-pay | Admitting: Nurse Practitioner

## 2021-12-19 ENCOUNTER — Other Ambulatory Visit: Payer: Self-pay

## 2021-12-19 ENCOUNTER — Ambulatory Visit (INDEPENDENT_AMBULATORY_CARE_PROVIDER_SITE_OTHER): Payer: Medicaid Other | Admitting: Nurse Practitioner

## 2021-12-19 DIAGNOSIS — J069 Acute upper respiratory infection, unspecified: Secondary | ICD-10-CM

## 2021-12-19 LAB — POCT INFLUENZA A/B
Influenza A, POC: NEGATIVE
Influenza B, POC: NEGATIVE

## 2021-12-19 MED ORDER — BENZONATATE 100 MG PO CAPS
100.0000 mg | ORAL_CAPSULE | Freq: Two times a day (BID) | ORAL | 0 refills | Status: DC | PRN
Start: 2021-12-19 — End: 2022-05-22

## 2021-12-19 MED ORDER — NOREL AD 4-10-325 MG PO TABS: 1.0000 | ORAL_TABLET | ORAL | 1 refills | Status: DC | PRN

## 2021-12-19 NOTE — Assessment & Plan Note (Addendum)
-  flu and covid test today -symptoms started 12/28, if no improvement by 12/23/21, would consider abx -Rx. norel and tessalon

## 2021-12-19 NOTE — Addendum Note (Signed)
Addended by: Jasper Riling on: 12/19/2021 02:30 PM   Modules accepted: Orders

## 2021-12-19 NOTE — Progress Notes (Signed)
Flu neg

## 2021-12-19 NOTE — Progress Notes (Signed)
Acute Office Visit  Subjective:    Patient ID: Dawn Mcpherson, female    DOB: 2001/04/26, 20 y.o.   MRN: [id number]  Chief Complaint  Patient presents with   Influenza    Cough,sore throat,body aches,fever,vomiting x 2 days.    Influenza Associated symptoms include chills, congestion, coughing, fatigue, headaches and a sore throat. Pertinent negatives include no fever.  Patient is in today for sick visit.  Symptoms started yesterday. She has tried nyquil, dayquil, and sudafed. No recent sick contacts except her mom.    Past Medical History:  Diagnosis Date   Anxiety    Depression    Depression    Phreesia 03/06/2021   Depression    Phreesia 04/30/2021   Headache(784.0)    IBS (irritable bowel syndrome)     Past Surgical History:  Procedure Laterality Date   COLONOSCOPY WITH ESOPHAGOGASTRODUODENOSCOPY (EGD)  07/2020   [facility], Dr. Maricela Bo   INCISION AND DRAINAGE      Family History  Problem Relation Age of Onset   Diabetes Father    Healthy Sister    Healthy Sister    Healthy Sister     Social History   Socioeconomic History   Marital status: Single    Spouse name: Not on file   Number of children: Not on file   Years of education: Not on file   Highest education level: Not on file  Occupational History   Occupation: [employer]    Comment: Office manager  Tobacco Use   Smoking status: Never    Passive exposure: Yes   Smokeless tobacco: Never  Vaping Use   Vaping Use: Never used  Substance and Sexual Activity   Alcohol use: No   Drug use: No   Sexual activity: Yes    Birth control/protection: None  Other Topics Concern   Not on file  Social History Narrative   Not on file   Social Determinants of Health   Financial Resource Strain: Not on file  Food Insecurity: Not on file  Transportation Needs: Not on file  Physical Activity: Not on file  Stress: Not on file  Social Connections: Not on file  Intimate Partner Violence: Not on  file    Outpatient Medications Prior to Visit  Medication Sig Dispense Refill   hydrOXYzine (VISTARIL) 25 MG capsule TAKE 1 CAPSULE EVERY 8 HOURS AS NEEDED FOR ANXIETY 60 capsule 1   metoprolol tartrate (LOPRESSOR) 25 MG tablet Take 0.5 tablets (12.5 mg total) by mouth 2 (two) times daily. 30 tablet 3   omeprazole (PRILOSEC) 20 MG capsule Take 1 capsule (20 mg total) by mouth daily. 90 capsule 3   ondansetron (ZOFRAN) 4 MG tablet Take 1 tablet (4 mg total) by mouth every 8 (eight) hours as needed for nausea or vomiting. 15 tablet 1   sertraline (ZOLOFT) 50 MG tablet Take 50 mg by mouth daily.     No facility-administered medications prior to visit.    Allergies  Allergen Reactions   Gluten Meal    Reglan [Metoclopramide] Palpitations    Review of Systems  Constitutional:  Positive for chills and fatigue. Negative for fever.  HENT:  Positive for congestion and sore throat.   Respiratory:  Positive for cough.   Neurological:  Positive for headaches.      Objective:    Physical Exam  There were no vitals taken for this visit. Wt Readings from Last 3 Encounters:  12/02/21 212 lb 0.6 oz (96.2 kg)  11/11/21 209  lb 6.4 oz (95 kg)  11/04/21 212 lb (96.2 kg)    Health Maintenance Due  Topic Date Due   COVID-19 Vaccine (1) Never done   HPV VACCINES (1 - 2-dose series) Never done   CHLAMYDIA SCREENING  Never done   HIV Screening  Never done   Hepatitis C Screening  Never done   TETANUS/TDAP  Never done   INFLUENZA VACCINE  Never done       Topic Date Due   HPV VACCINES (1 - 2-dose series) Never done     Lab Results  Component Value Date   TSH 2.330 11/04/2021   Lab Results  Component Value Date   WBC 12.1 (H) 11/04/2021   HGB 13.8 11/04/2021   HCT 40.4 11/04/2021   MCV 81.6 11/04/2021   PLT 397 11/04/2021   Lab Results  Component Value Date   NA 139 11/04/2021   K 3.7 11/04/2021   CO2 28 11/04/2021   GLUCOSE 97 11/04/2021   BUN 10 11/04/2021   CREATININE  0.91 11/04/2021   BILITOT 0.7 11/04/2021   ALKPHOS 46 11/04/2021   AST 12 (L) 11/04/2021   ALT 14 11/04/2021   PROT 7.2 11/04/2021   ALBUMIN 4.6 11/04/2021   CALCIUM 9.3 11/04/2021   ANIONGAP 8 11/04/2021   EGFR 100 04/30/2021   Lab Results  Component Value Date   CHOL 156 04/30/2021   Lab Results  Component Value Date   HDL 41 04/30/2021   Lab Results  Component Value Date   LDLCALC 104 04/30/2021   Lab Results  Component Value Date   TRIG 52 04/30/2021   Lab Results  Component Value Date   CHOLHDL 3.8 09/20/2014   No results found for: HGBA1C     Assessment & Plan:   Problem List Items Addressed This Visit       Respiratory   URI (upper respiratory infection) - Primary    -flu and covid test today -symptoms started 12/28, if no improvement by 12/23/21, would consider abx -Rx. norel and tessalon      Relevant Medications   Chlorphen-PE-Acetaminophen (NOREL AD) 4-10-325 MG TABS   benzonatate (TESSALON) 100 MG capsule     Meds ordered this encounter  Medications   Chlorphen-PE-Acetaminophen (NOREL AD) 4-10-325 MG TABS    Sig: Take 1 tablet by mouth every 4 (four) hours as needed (nasal congestion, cold symptoms).    Dispense:  20 tablet    Refill:  1   benzonatate (TESSALON) 100 MG capsule    Sig: Take 1 capsule (100 mg total) by mouth 2 (two) times daily as needed for cough.    Dispense:  20 capsule    Refill:  0   Date:  12/19/2021   Location of Patient: Home Location of Provider: Office Consent was obtain for visit to be over via telehealth. I verified that I am speaking with the correct person using two identifiers.  I connected with  Kamden Reber Weidemann on 12/19/21 via telephone and verified that I am speaking with the correct person using two identifiers.   I discussed the limitations of evaluation and management by telemedicine. The patient expressed understanding and agreed to proceed.  Time spent: 9 min     Noreene Larsson, NP

## 2021-12-19 NOTE — Patient Instructions (Addendum)
Flu and COVID swab today.  I sent in norel and tessalon perles for your symptoms.    I will be moving to [facility] located at [address], [place], [place] [address] effective Dec 22, 2021. If you would like to establish care with [facility] please call [phone].

## 2021-12-20 ENCOUNTER — Telehealth: Payer: Self-pay | Admitting: Nurse Practitioner

## 2021-12-20 NOTE — Telephone Encounter (Signed)
COVID hasn't resulted yet. She is probably still contagious since she is symptomatic. Generally she needs to be symptom free without medicine for 24 hours to not be contagious. Covid results should be back today or tomorrow, so make sure she has MyChart. If she does choose to travel after getting negative COVID results, she should at least wear a mask to prevent spreading germs.

## 2021-12-20 NOTE — Telephone Encounter (Signed)
Pt called to check if it was safe for her to travel. Pt has trip coming up.

## 2021-12-21 ENCOUNTER — Telehealth: Payer: Medicaid Other | Admitting: Physician Assistant

## 2021-12-21 DIAGNOSIS — J069 Acute upper respiratory infection, unspecified: Secondary | ICD-10-CM | POA: Diagnosis not present

## 2021-12-21 LAB — SARS-COV-2, NAA 2 DAY TAT

## 2021-12-21 LAB — NOVEL CORONAVIRUS, NAA: SARS-CoV-2, NAA: NOT DETECTED

## 2021-12-21 MED ORDER — AZITHROMYCIN 250 MG PO TABS: ORAL_TABLET | ORAL | 0 refills | Status: AC

## 2021-12-21 NOTE — Patient Instructions (Signed)
°  Mauri Reading Skarda, thank you for joining [name], PA-C for today's virtual visit.  While this provider is not your primary care provider (PCP), if your PCP is located in our provider database this encounter information will be shared with them immediately following your visit.  Consent: (Patient) Shekita Boyden Defoor provided verbal consent for this virtual visit at the beginning of the encounter.  Current Medications:  Current Outpatient Medications:    azithromycin (ZITHROMAX) 250 MG tablet, Take 2 tablets on day 1, then 1 tablet daily on days 2 through 5, Disp: 6 tablet, Rfl: 0   benzonatate (TESSALON) 100 MG capsule, Take 1 capsule (100 mg total) by mouth 2 (two) times daily as needed for cough., Disp: 20 capsule, Rfl: 0   Chlorphen-PE-Acetaminophen (NOREL AD) 4-10-325 MG TABS, Take 1 tablet by mouth every 4 (four) hours as needed (nasal congestion, cold symptoms)., Disp: 20 tablet, Rfl: 1   hydrOXYzine (VISTARIL) 25 MG capsule, TAKE 1 CAPSULE EVERY 8 HOURS AS NEEDED FOR ANXIETY, Disp: 60 capsule, Rfl: 1   metoprolol tartrate (LOPRESSOR) 25 MG tablet, Take 0.5 tablets (12.5 mg total) by mouth 2 (two) times daily., Disp: 30 tablet, Rfl: 3   omeprazole (PRILOSEC) 20 MG capsule, Take 1 capsule (20 mg total) by mouth daily., Disp: 90 capsule, Rfl: 3   ondansetron (ZOFRAN) 4 MG tablet, Take 1 tablet (4 mg total) by mouth every 8 (eight) hours as needed for nausea or vomiting., Disp: 15 tablet, Rfl: 1   sertraline (ZOLOFT) 50 MG tablet, Take 50 mg by mouth daily., Disp: , Rfl:    Medications ordered in this encounter:  Meds ordered this encounter  Medications   azithromycin (ZITHROMAX) 250 MG tablet    Sig: Take 2 tablets on day 1, then 1 tablet daily on days 2 through 5    Dispense:  6 tablet    Refill:  0    Order Specific Question:   Supervising Provider    Answer:   Eber Hong [[id number]]     *If you need refills on other medications prior to your next appointment, please contact your  pharmacy*  Follow-Up: Call back or seek an in-person evaluation if the symptoms worsen or if the condition fails to improve as anticipated.  Other Instructions Please keep well-hydrated and get plenty of rest. Alternate tylenol and ibuprofen if needed for throat pain. Use OTC Mucinex to help with congestion. Continue the cough medication previously given. If you have a humidifier, place in the bedroom and run at night.  Again, I want you to take another COVID test. If positive, let us know ASAP. If negative, you can take the Azithromycin as directed.    If you have been instructed to have an in-person evaluation today at a local Urgent Care facility, please use the link below. It will take you to a list of all of our available [facility], including address, phone number and hours of operation. Please do not delay care.  [facility]  If you or a family member do not have a primary care provider, use the link below to schedule a visit and establish care. When you choose a [place] primary care physician or advanced practice provider, you gain a long-term partner in health. Find a Primary Care Provider  Learn more about [place]'s in-office and virtual care options: [place] - Get Care Now

## 2021-12-21 NOTE — Progress Notes (Signed)
Virtual Visit Consent   Dawn Mcpherson, you are scheduled for a virtual visit with a [facility] provider today.     Just as with appointments in the office, your consent must be obtained to participate.  Your consent will be active for this visit and any virtual visit you may have with one of our providers in the next 365 days.     If you have a MyChart account, a copy of this consent can be sent to you electronically.  All virtual visits are billed to your insurance company just like a traditional visit in the office.    As this is a virtual visit, video technology does not allow for your provider to perform a traditional examination.  This may limit your provider's ability to fully assess your condition.  If your provider identifies any concerns that need to be evaluated in person or the need to arrange testing (such as labs, EKG, etc.), we will make arrangements to do so.     Although advances in technology are sophisticated, we cannot ensure that it will always work on either your end or our end.  If the connection with a video visit is poor, the visit may have to be switched to a telephone visit.  With either a video or telephone visit, we are not always able to ensure that we have a secure connection.     I need to obtain your verbal consent now.   Are you willing to proceed with your visit today?    Dawn Mcpherson has provided verbal consent on 12/21/2021 for a virtual visit (video or telephone).   [name], [place]   Date: 12/21/2021 3:59 PM   Virtual Visit via Video Note   I, [name], connected with  Dawn Mcpherson  ([id number], 2001-08-26) on 12/21/21 at  3:45 PM EST by a video-enabled telemedicine application and verified that I am speaking with the correct person using two identifiers.  Location: Patient: Virtual Visit Location Patient: Home Provider: Virtual Visit Location Provider: Home Office   I discussed the limitations of evaluation and management by  telemedicine and the availability of in person appointments. The patient expressed understanding and agreed to proceed.    History of Present Illness: Dawn Mcpherson is a 20 y.o. who identifies as a female who was assigned female at birth, and is being seen today for worsening URI symptoms. Notes at beginning of week was having a mild cough with some scratchy throat and sore throat. This continued so Thursday took a COVID/flu test (negative for both) and had visit with her PCP office at which time she was diagnosed with suspected viral URI -- started on Tessalon Perles. As of today she is now with worsening sore throat, chest congestion and cough, now with fever with Tmax 101.4. Some loose stools. Denies chest pain but notes tenderness with coughing.    HPI: HPI  Problems:  Patient Active Problem List   Diagnosis Date Noted   URI (upper respiratory infection) 12/19/2021   PSVT (paroxysmal supraventricular tachycardia) (HCC) 12/02/2021   Chest pain 12/02/2021   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   Right lower quadrant abdominal pain 09/23/2021   Nausea and vomiting 09/23/2021   Left foot pain 06/18/2021   Acute serous otitis media of left ear without rupture 05/03/2021   Constipation 05/03/2021   Encounter to establish care 03/06/2021   ADHD 03/06/2021   Weight gain, abnormal 03/06/2021   Hidradenitis suppurativa of right axilla 03/06/2021  MDD (major depressive disorder), single episode, severe , no psychosis ([facility]) 09/20/2014   Generalized anxiety disorder 09/20/2014    Allergies:  Allergies  Allergen Reactions   Gluten Meal    Reglan [Metoclopramide] Palpitations   Medications:  Current Outpatient Medications:    azithromycin (ZITHROMAX) 250 MG tablet, Take 2 tablets on day 1, then 1 tablet daily on days 2 through 5, Disp: 6 tablet, Rfl: 0   benzonatate (TESSALON) 100 MG capsule, Take 1 capsule (100 mg total) by mouth 2 (two) times daily as needed for cough., Disp: 20  capsule, Rfl: 0   Chlorphen-PE-Acetaminophen (NOREL AD) 4-10-325 MG TABS, Take 1 tablet by mouth every 4 (four) hours as needed (nasal congestion, cold symptoms)., Disp: 20 tablet, Rfl: 1   hydrOXYzine (VISTARIL) 25 MG capsule, TAKE 1 CAPSULE EVERY 8 HOURS AS NEEDED FOR ANXIETY, Disp: 60 capsule, Rfl: 1   metoprolol tartrate (LOPRESSOR) 25 MG tablet, Take 0.5 tablets (12.5 mg total) by mouth 2 (two) times daily., Disp: 30 tablet, Rfl: 3   omeprazole (PRILOSEC) 20 MG capsule, Take 1 capsule (20 mg total) by mouth daily., Disp: 90 capsule, Rfl: 3   ondansetron (ZOFRAN) 4 MG tablet, Take 1 tablet (4 mg total) by mouth every 8 (eight) hours as needed for nausea or vomiting., Disp: 15 tablet, Rfl: 1   sertraline (ZOLOFT) 50 MG tablet, Take 50 mg by mouth daily., Disp: , Rfl:   Observations/Objective: Patient is well-developed, well-nourished in no acute distress.  Resting comfortably at home.  Head is normocephalic, atraumatic.  No labored breathing. Speech is clear and coherent with logical content.  Patient is alert and oriented at baseline.   Assessment and Plan: 1. Upper respiratory tract infection, unspecified type  Giving negative flu and COVID test and worsening symptoms with new-onset fever, this does raise concern for bacterial cause. Will have her repeat home COVID test x 1 just to be overly cautious. She is to let us know if this is positive. If negative, will have her continue hydration and rest. Supportive measures and OTC medications reviewed. Will add on Azithromycin for her to start pending negative COVID test.   Follow Up Instructions: I discussed the assessment and treatment plan with the patient. The patient was provided an opportunity to ask questions and all were answered. The patient agreed with the plan and demonstrated an understanding of the instructions.  A copy of instructions were sent to the patient via MyChart unless otherwise noted below.   The patient was advised to  call back or seek an in-person evaluation if the symptoms worsen or if the condition fails to improve as anticipated.  Time:  I spent 15 minutes with the patient via telehealth technology discussing the above problems/concerns.    Leeanne Rio, PA-C

## 2022-01-02 ENCOUNTER — Ambulatory Visit (INDEPENDENT_AMBULATORY_CARE_PROVIDER_SITE_OTHER): Payer: PRIVATE HEALTH INSURANCE | Admitting: Gastroenterology

## 2022-01-02 ENCOUNTER — Encounter (INDEPENDENT_AMBULATORY_CARE_PROVIDER_SITE_OTHER): Payer: Self-pay | Admitting: Gastroenterology

## 2022-02-08 ENCOUNTER — Telehealth: Payer: Medicaid Other | Admitting: Nurse Practitioner

## 2022-02-08 DIAGNOSIS — U071 COVID-19: Secondary | ICD-10-CM

## 2022-02-08 MED ORDER — MOLNUPIRAVIR EUA 200MG CAPSULE: 4.0000 | ORAL_CAPSULE | Freq: Two times a day (BID) | ORAL | 0 refills | Status: AC

## 2022-02-08 NOTE — Patient Instructions (Signed)

## 2022-02-08 NOTE — Progress Notes (Signed)
Virtual Visit Consent   Dawn Mcpherson, you are scheduled for a virtual visit with Mary-Margaret Hassell Done, FNP, a [facility] provider, today.     Just as with appointments in the office, your consent must be obtained to participate.  Your consent will be active for this visit and any virtual visit you may have with one of our providers in the next 365 days.     If you have a MyChart account, a copy of this consent can be sent to you electronically.  All virtual visits are billed to your insurance company just like a traditional visit in the office.    As this is a virtual visit, video technology does not allow for your provider to perform a traditional examination.  This may limit your provider's ability to fully assess your condition.  If your provider identifies any concerns that need to be evaluated in person or the need to arrange testing (such as labs, EKG, etc.), we will make arrangements to do so.     Although advances in technology are sophisticated, we cannot ensure that it will always work on either your end or our end.  If the connection with a video visit is poor, the visit may have to be switched to a telephone visit.  With either a video or telephone visit, we are not always able to ensure that we have a secure connection.     I need to obtain your verbal consent now.   Are you willing to proceed with your visit today? YES   Falisa Marcucci Stieber has provided verbal consent on 02/08/2022 for a virtual visit (video or telephone).   Mary-Margaret Hassell Done, FNP   Date: 02/08/2022 1:29 PM   Virtual Visit via Video Note   I, Mary-Margaret Hassell Done, connected with Leza Ashdown Caillier ([id number], 10-Feb-2001) on 02/08/22 at  1:30 PM EST by a video-enabled telemedicine application and verified that I am speaking with the correct person using two identifiers.  Location: Patient: Virtual Visit Location Patient: Home Provider: Virtual Visit Location Provider: Mobile   I discussed the limitations of  evaluation and management by telemedicine and the availability of in person appointments. The patient expressed understanding and agreed to proceed.    History of Present Illness: Dawn Mcpherson is a 21 y.o. who identifies as a female who was assigned female at birth, and is being seen today for covid positive.  HPI: URI  This is a new problem. The current episode started in the past 7 days (Thusrday evening). The problem has been gradually worsening. The maximum temperature recorded prior to her arrival was 101 - 101.9 F. The fever has been present for 1 to 2 days. Associated symptoms include congestion, coughing, headaches, rhinorrhea and a sore throat. She has tried acetaminophen and NSAIDs for the symptoms. The treatment provided mild relief.   Tested positive for covid on Thursday night. Review of Systems  HENT:  Positive for congestion, rhinorrhea and sore throat.   Respiratory:  Positive for cough.   Neurological:  Positive for headaches.   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   Right lower quadrant abdominal pain 09/23/2021   Nausea and vomiting 09/23/2021   Left foot pain 06/18/2021   Acute serous otitis media of left ear without rupture 05/03/2021   Constipation 05/03/2021  Encounter to establish care 03/06/2021   ADHD 03/06/2021   Weight gain, abnormal 03/06/2021   Hidradenitis suppurativa of right axilla 03/06/2021   MDD (major depressive disorder), single episode, severe , no psychosis ([facility]) 09/20/2014   Generalized anxiety disorder 09/20/2014    Allergies:  Allergies  Allergen Reactions   Gluten Meal    Reglan [Metoclopramide] Palpitations   Medications:  Current Outpatient Medications:    benzonatate (TESSALON) 100 MG capsule, Take 1 capsule (100 mg total) by  mouth 2 (two) times daily as needed for cough., Disp: 20 capsule, Rfl: 0   Chlorphen-PE-Acetaminophen (NOREL AD) 4-10-325 MG TABS, Take 1 tablet by mouth every 4 (four) hours as needed (nasal congestion, cold symptoms)., Disp: 20 tablet, Rfl: 1   hydrOXYzine (VISTARIL) 25 MG capsule, TAKE 1 CAPSULE EVERY 8 HOURS AS NEEDED FOR ANXIETY, Disp: 60 capsule, Rfl: 1   metoprolol tartrate (LOPRESSOR) 25 MG tablet, Take 0.5 tablets (12.5 mg total) by mouth 2 (two) times daily., Disp: 30 tablet, Rfl: 3   omeprazole (PRILOSEC) 20 MG capsule, Take 1 capsule (20 mg total) by mouth daily., Disp: 90 capsule, Rfl: 3   ondansetron (ZOFRAN) 4 MG tablet, Take 1 tablet (4 mg total) by mouth every 8 (eight) hours as needed for nausea or vomiting., Disp: 15 tablet, Rfl: 1   sertraline (ZOLOFT) 50 MG tablet, Take 50 mg by mouth daily., Disp: , Rfl:   Observations/Objective: Patient is well-developed, well-nourished in no acute distress.  Resting comfortably  at home.  Head is normocephalic, atraumatic.  No labored breathing.  Speech is clear and coherent with logical content.  Patient is alert and oriented at baseline.  Raspy voice Deep dry cough  Assessment and Plan:  Holliann Mccartt Ates in today with chief complaint of No chief complaint on file.   1. Positive self-administered antigen test for COVID-19 1. Take meds as prescribed 2. Use a cool mist humidifier especially during the winter months and when heat has been humid. 3. Use saline nose sprays frequently 4. Saline irrigations of the nose can be very helpful if done frequently.  * 4X daily for 1 week*  * Use of a nettie pot can be helpful with this. Follow directions with this* 5. Drink plenty of fluids 6. Keep thermostat turn down low 7.For any cough or congestion- delsym 8. For fever or aces or pains- take tylenol or ibuprofen appropriate for age and weight.  * for fevers greater than 101 orally you may alternate ibuprofen and tylenol every  3  hours.    Meds ordered this encounter  Medications   molnupiravir EUA (LAGEVRIO) 200 mg CAPS capsule    Sig: Take 4 capsules (800 mg total) by mouth 2 (two) times daily for 5 days.    Dispense:  40 capsule    Refill:  0    Order Specific Question:   Supervising Provider    Answer:   Noemi Chapel [[id number]]     Follow Up Instructions: I discussed the assessment and treatment plan with the patient. The patient was provided an opportunity to ask questions and all were answered. The patient agreed with the plan and demonstrated an understanding of the instructions.  A copy of instructions were sent to the patient via MyChart.  The patient was advised to call back or seek an in-person evaluation if the symptoms worsen or if the condition fails to improve as anticipated.  Time:  I spent 7 minutes with the patient via telehealth technology discussing the above  problems/concerns.    Mary-Margaret Hassell Done, FNP

## 2022-02-10 ENCOUNTER — Ambulatory Visit: Payer: Medicaid Other | Admitting: Nurse Practitioner

## 2022-03-16 IMAGING — DX DG FOOT COMPLETE 3+V*L*
3 series · 3 of 3 positions shown · non-contrast
Comparison: Foot radiograph 11/06/2016

CLINICAL DATA: Left foot pain. Tripped yesterday and great toe went
under foot. Great toe and metatarsal pain.

EXAM:
LEFT FOOT - COMPLETE 3+ VIEW

[foot ap]
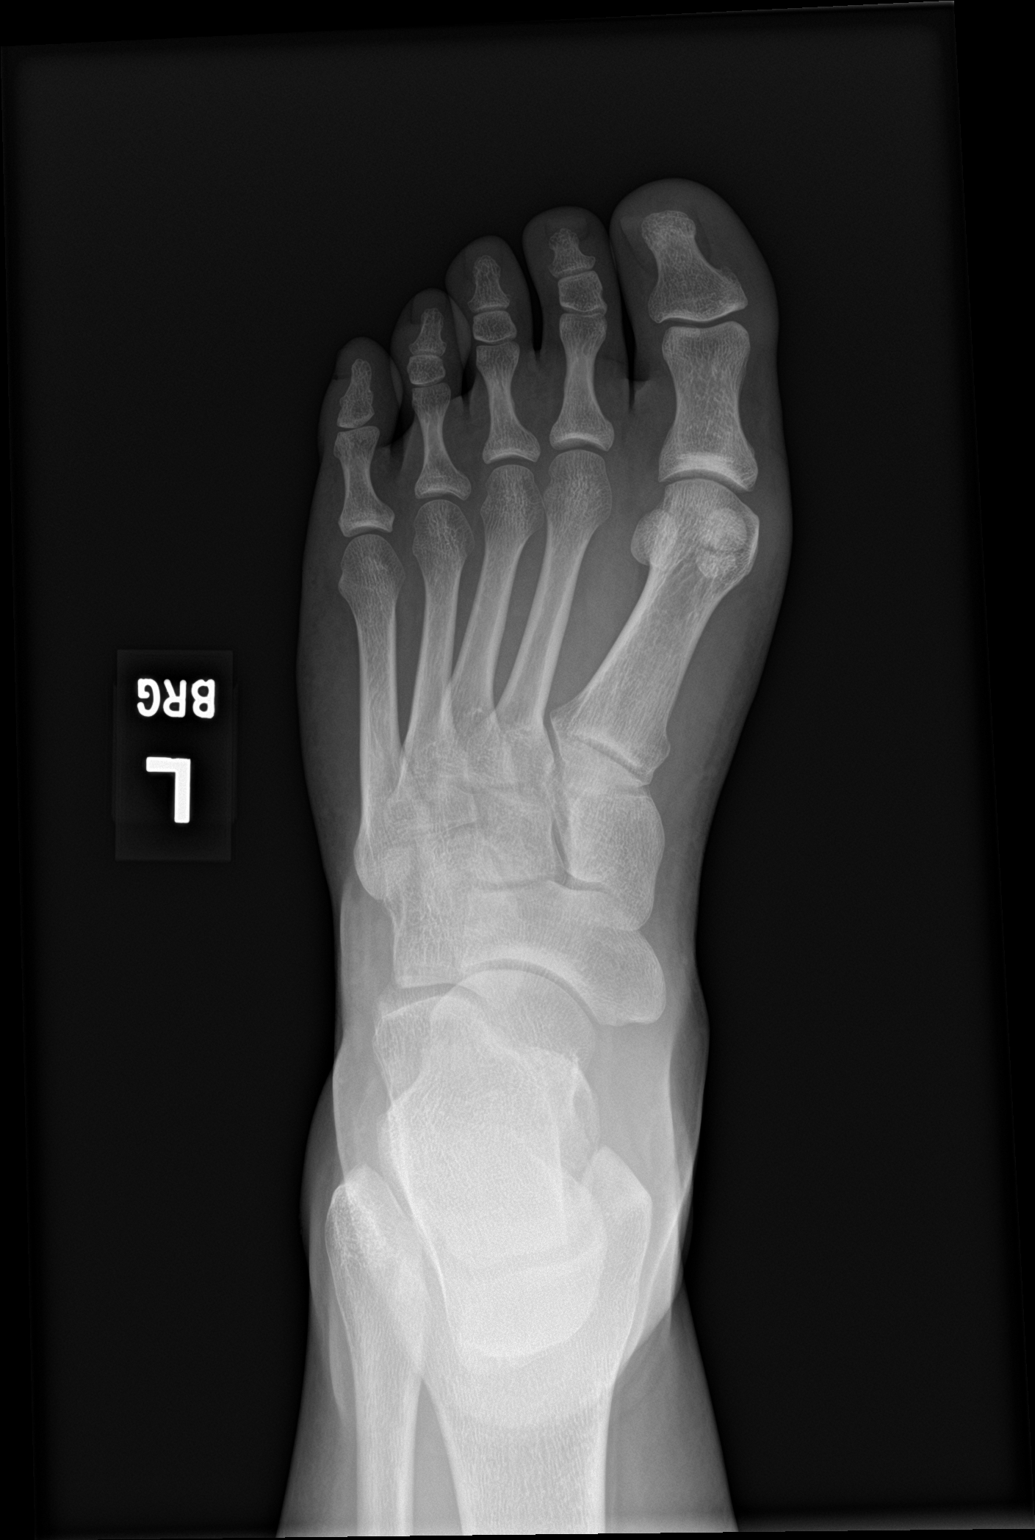

[foot obl]
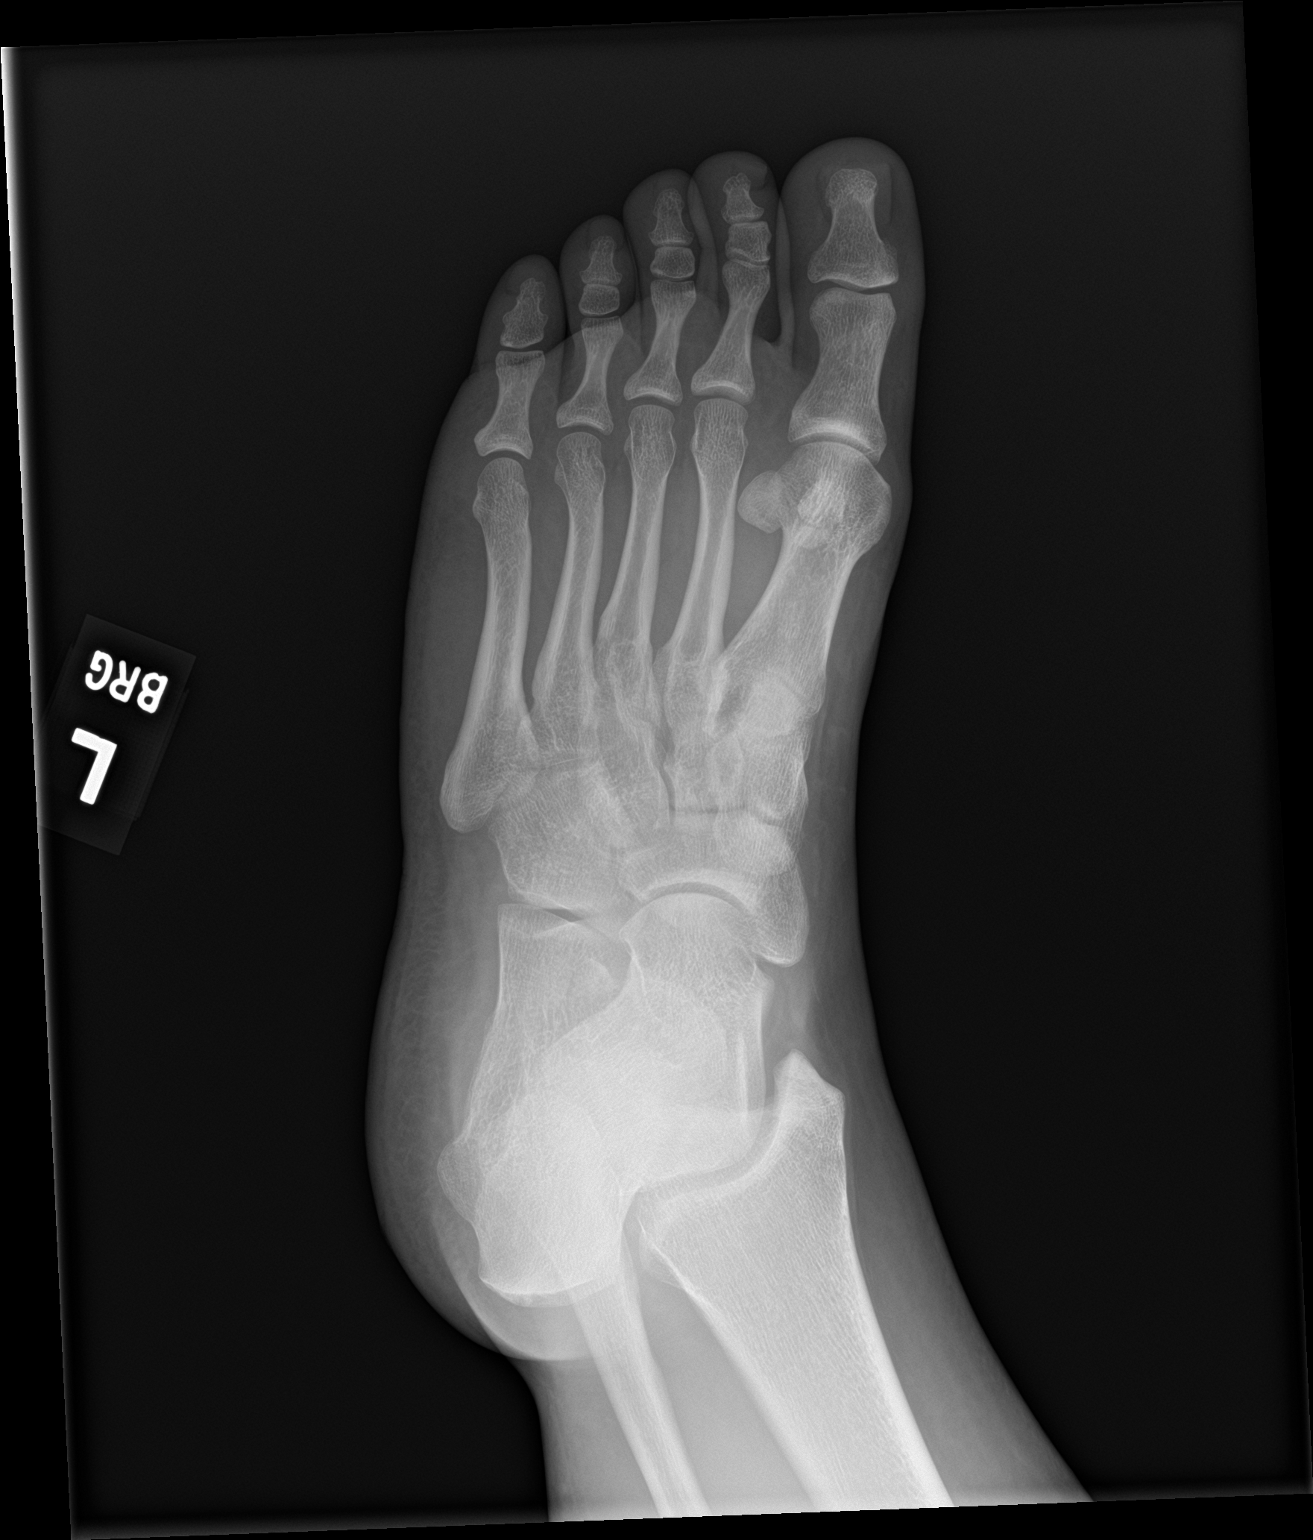

[foot lat]
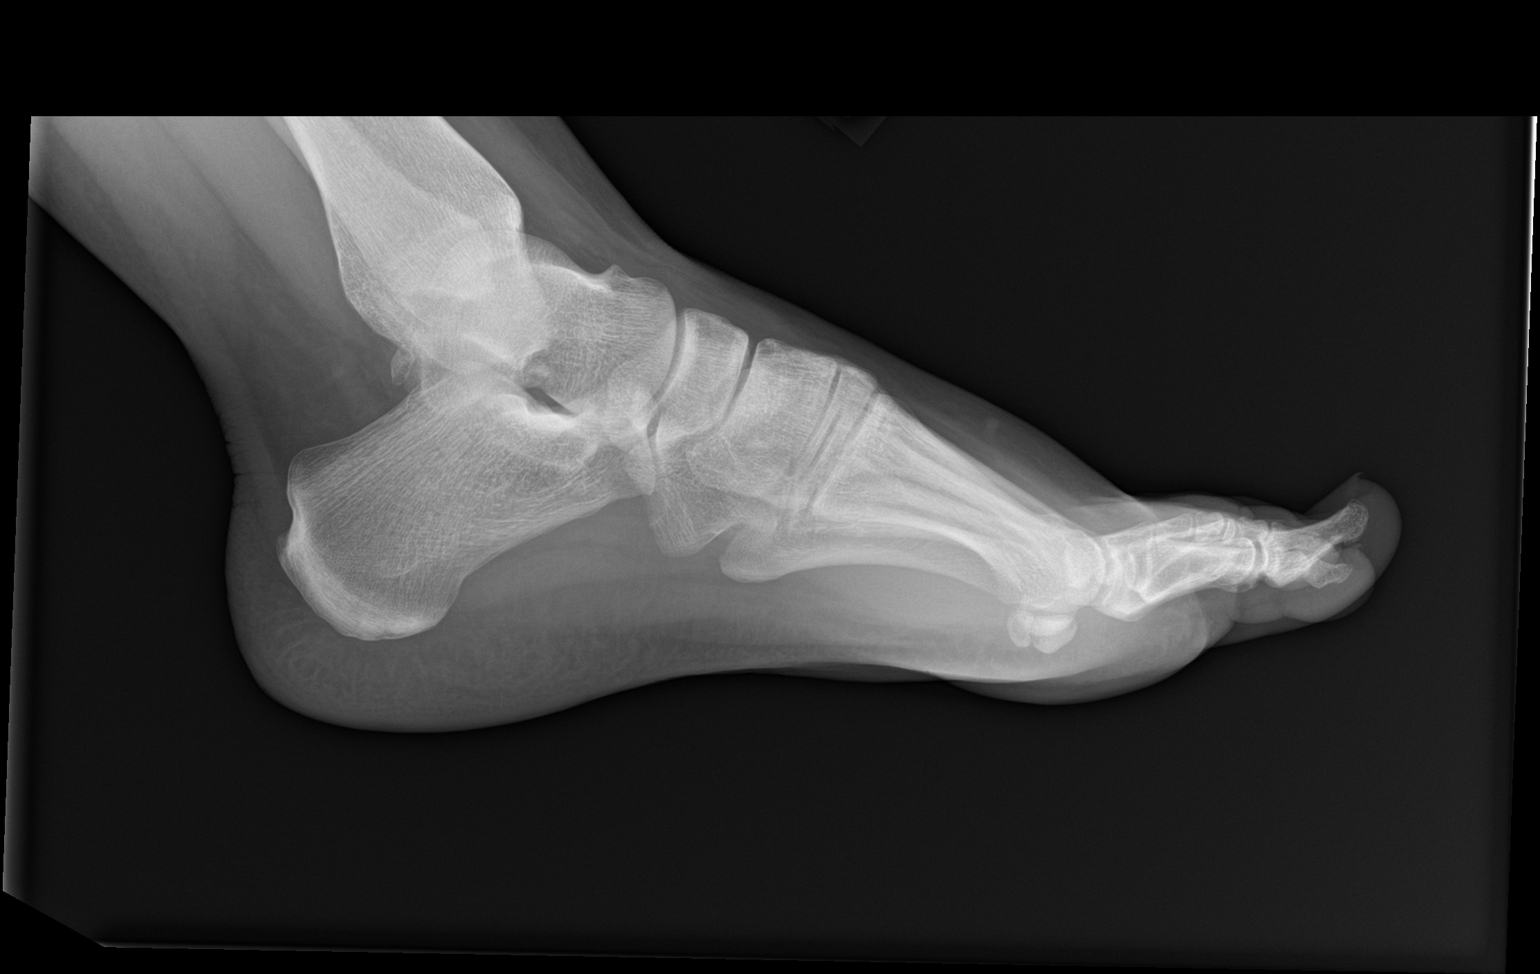

[3 of 3 positions shown; findings below may reference images not displayed]

FINDINGS: There is no evidence of fracture or dislocation. Particularly the
great toe and metatarsals are intact. There is no evidence of
arthropathy or other focal bone abnormality. Chronic rug mentation
of the tibial sesamoid, normal variant. Soft tissues are
unremarkable.
IMPRESSION: Negative radiographs of the left foot.

## 2022-03-19 ENCOUNTER — Emergency Department (HOSPITAL_BASED_OUTPATIENT_CLINIC_OR_DEPARTMENT_OTHER): Payer: PRIVATE HEALTH INSURANCE | Admitting: Radiology

## 2022-03-19 ENCOUNTER — Other Ambulatory Visit: Payer: Self-pay

## 2022-03-19 ENCOUNTER — Encounter (HOSPITAL_BASED_OUTPATIENT_CLINIC_OR_DEPARTMENT_OTHER): Payer: Self-pay

## 2022-03-19 ENCOUNTER — Emergency Department (HOSPITAL_BASED_OUTPATIENT_CLINIC_OR_DEPARTMENT_OTHER)
Admission: EM | Admit: 2022-03-19 | Discharge: 2022-03-19 | Disposition: A | Discharge status: Home / Self Care | Payer: PRIVATE HEALTH INSURANCE | Attending: Emergency Medicine | Admitting: Emergency Medicine

## 2022-03-19 DIAGNOSIS — N9489 Other specified conditions associated with female genital organs and menstrual cycle: Secondary | ICD-10-CM | POA: Diagnosis not present

## 2022-03-19 DIAGNOSIS — R000 Tachycardia, unspecified: Secondary | ICD-10-CM | POA: Insufficient documentation

## 2022-03-19 DIAGNOSIS — R0602 Shortness of breath: Secondary | ICD-10-CM | POA: Diagnosis not present

## 2022-03-19 DIAGNOSIS — R002 Palpitations: Secondary | ICD-10-CM | POA: Diagnosis not present

## 2022-03-19 LAB — CBC
HCT: 42.5 % (ref 36.0–46.0)
Hemoglobin: 14.3 g/dL (ref 12.0–15.0)
MCH: 27.4 pg (ref 26.0–34.0)
MCHC: 33.6 g/dL (ref 30.0–36.0)
MCV: 81.4 fL (ref 80.0–100.0)
Platelets: 470 10*3/uL — ABNORMAL HIGH (ref 150–400)
RBC: 5.22 MIL/uL — ABNORMAL HIGH (ref 3.87–5.11)
RDW: 12.4 % (ref 11.5–15.5)
WBC: 11.1 10*3/uL — ABNORMAL HIGH (ref 4.0–10.5)
nRBC: 0 % (ref 0.0–0.2)

## 2022-03-19 LAB — TROPONIN I (HIGH SENSITIVITY)
Troponin I (High Sensitivity): <2 ng/L (ref <18)
Troponin I (High Sensitivity): <2 ng/L (ref <18)

## 2022-03-19 LAB — BASIC METABOLIC PANEL
Anion gap: 10 (ref 5–15)
BUN: 13 mg/dL (ref 6–20)
CO2: 27 mmol/L (ref 22–32)
Calcium: 9.9 mg/dL (ref 8.9–10.3)
Chloride: 104 mmol/L (ref 98–111)
Creatinine, Ser: 0.97 mg/dL (ref 0.44–1.00)
GFR, Estimated: >60 mL/min (ref >60)
Glucose, Bld: 77 mg/dL (ref 70–99)
Potassium: 4.1 mmol/L (ref 3.5–5.1)
Sodium: 141 mmol/L (ref 135–145)

## 2022-03-19 LAB — BRAIN NATRIURETIC PEPTIDE: B Natriuretic Peptide: 23.4 pg/mL (ref 0.0–100.0)

## 2022-03-19 LAB — HCG, SERUM, QUALITATIVE: Preg, Serum: NEGATIVE

## 2022-03-19 NOTE — ED Provider Notes (Signed)
? ?[facility]  ?Provider Note ? ?CSN: [id number] ?Arrival date & time: 03/19/22 1855 ? ?History ?Chief Complaint  ?Patient presents with  ? Shortness of Breath  ? ? ?Dawn Mcpherson is a 21 y.o. female with history of palpitations, worse a cardiac monitor several months ago, showed intermittent episodes of SVT. Started on metoprolol, but symptoms persistent. 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 She has been wearing another 28 day monitor since then. She reports she still gets tachycardic and SOB with walking but responds well to rest. Stress Echo at [facility] was neg recently. Today she had an episode of tachycardia and SOB while at rest in class. Her Apple Watch showed one brief episode of HR 145 but otherewise has been in 110s or lower the rest of the day. Feeling better at the time of my evaluation. ? ? ?Home Medications ?Prior to Admission medications   ?Medication Sig Start Date End Date Taking? Authorizing Provider  ?benzonatate (TESSALON) 100 MG capsule Take 1 capsule (100 mg total) by mouth 2 (two) times daily as needed for cough. 12/19/21   Heather Roberts, NP  ?Chlorphen-PE-Acetaminophen (NOREL AD) 4-10-325 MG TABS Take 1 tablet by mouth every 4 (four) hours as needed (nasal congestion, cold symptoms). 12/19/21   Heather Roberts, NP  ?hydrOXYzine (VISTARIL) 25 MG capsule TAKE 1 CAPSULE EVERY 8 HOURS AS NEEDED FOR ANXIETY 11/21/21   Heather Roberts, NP  ?metoprolol tartrate (LOPRESSOR) 25 MG tablet Take 0.5 tablets (12.5 mg total) by mouth 2 (two) times daily. 11/29/21 02/27/22  Maisie Fus, MD  ?omeprazole (PRILOSEC) 20 MG capsule Take 1 capsule (20 mg total) by mouth daily. 09/23/21   Carlan, Chelsea L, NP  ?ondansetron (ZOFRAN) 4 MG tablet Take 1 tablet (4 mg total) by mouth every 8 (eight) hours as needed for nausea or vomiting. 09/23/21   Carlan,  Chelsea L, NP  ?sertraline (ZOLOFT) 50 MG tablet Take 50 mg by mouth daily. 10/30/21   [provider]  ? ? ? ?Allergies    ?Gluten meal and Reglan [metoclopramide] ? ? ?Review of Systems   ?Review of Systems ?Please see HPI for pertinent positives and negatives ? ?Physical Exam ?BP 115/83   Pulse 86   Temp 98.4 ?F (36.9 ?C) (Oral)   Resp 20   Ht 5\' 3"  (1.6 m)   Wt 96.2 kg   SpO2 100%   BMI 37.57 kg/m?  ? ?Physical Exam ?Vitals and nursing note reviewed.  ?Constitutional:   ?   Appearance: Normal appearance.  ?HENT:  ?   Head: Normocephalic and atraumatic.  ?   Nose: Nose normal.  ?   Mouth/Throat:  ?   Mouth: Mucous membranes are moist.  ?Eyes:  ?   Extraocular Movements: Extraocular movements intact.  ?   Conjunctiva/sclera: Conjunctivae normal.  ?Cardiovascular:  ?   Rate and Rhythm: Normal rate.  ?Pulmonary:  ?   Effort: Pulmonary effort is normal.  ?Abdominal:  ?   General: Abdomen is flat.  ?   Palpations: Abdomen is soft.  ?   Tenderness: There is no abdominal tenderness.  ?Musculoskeletal:     ?   General: No swelling. Normal range of motion.  ?   Cervical back: Neck supple.  ?Skin: ?   General: Skin is warm and dry.  ?Neurological:  ?  General: No focal deficit present.  ?   Mental Status: She is alert.  ?Psychiatric:     ?   Mood and Affect: Mood normal.  ? ? ?ED Results / Procedures / Treatments   ?EKG ?EKG Interpretation ? ?Date/Time:  Wednesday March 19 2022 19:12:15 EDT ?Ventricular Rate:  119 ?PR Interval:  128 ?QRS Duration: 84 ?QT Interval:  310 ?QTC Calculation: 436 ?R Axis:   86 ?Text Interpretation: Sinus tachycardia Nonspecific ST abnormality Abnormal ECG When compared with ECG of 04-Nov-2021 16:39, No significant change since last tracing Confirmed by Alvira Monday ([id number]) on 03/19/2022 9:10:45 PM ? ?Procedures ?Procedures ? ?Medications Ordered in the ED ?Medications - No data to display ? ?Initial Impression and Plan ? Patient is well appearing, no distress. Initial EKG with  mild sinus tachycardia has been NSR on the monitor for the last several hours while waiting to be seen. Labs done in triage show unremarkable CBC, BMP, Trop x 2, BNP. I personally viewed the images from radiology studies and agree with radiologist interpretation: CXR is clear. Patient advised no signs of ACS or acute CHF. Doubt PE given duration of her symptoms and lack of hypoxia or tachycardia at rest here. Recommend she follow up with her Cardiologist tomorrow to extract rhythm data from her monitor and for further management. ? ? ?ED Course  ? ?  ? ? ?MDM Rules/Calculators/A&P ?Medical Decision Making ?Problems Addressed: ?Palpitations: acute illness or injury ?Shortness of breath: acute illness or injury ? ?Amount and/or Complexity of Data Reviewed ?Labs: ordered. Decision-making details documented in ED Course. ?Radiology: ordered and independent interpretation performed. Decision-making details documented in ED Course. ? ? ? ?Final Clinical Impression(s) / ED Diagnoses ?Final diagnoses:  ?Palpitations  ?Shortness of breath  ? ? ?Rx / DC Orders ?ED Discharge Orders   ? ? None  ? ?  ? ?  ?Pollyann Savoy, MD ?03/19/22 2338 ? ?

## 2022-03-19 NOTE — ED Triage Notes (Signed)
Patient here POV from Home with SOB. ? ?Patient states she has been having a more recent Cardiac History including Intermittent A. Fib., Fluid Buildup, and Cardiac Monitoring.  ? ?More recently the Patient has been having increasing SOB, especially today, while at rest. ? ?Patient does take 20 mg of Lasix and 25 mg of Metoprolol.  ? ?No Fevers. Moderate Nausea. 1 Episode of Emesis. No Diarrhea. ? ?NAD Noted during Triage. A&Ox4. GCS 15. Ambulatory. ?

## 2022-03-24 ENCOUNTER — Other Ambulatory Visit: Payer: Self-pay

## 2022-03-24 DIAGNOSIS — F411 Generalized anxiety disorder: Secondary | ICD-10-CM

## 2022-03-24 MED ORDER — HYDROXYZINE PAMOATE 25 MG PO CAPS: ORAL_CAPSULE | ORAL | 1 refills | Status: DC

## 2022-04-13 ENCOUNTER — Other Ambulatory Visit: Payer: Self-pay | Admitting: Internal Medicine

## 2022-04-13 DIAGNOSIS — I471 Supraventricular tachycardia: Secondary | ICD-10-CM

## 2022-05-08 ENCOUNTER — Encounter: Payer: Self-pay | Admitting: Internal Medicine

## 2022-05-09 ENCOUNTER — Encounter: Payer: PRIVATE HEALTH INSURANCE | Admitting: Nurse Practitioner

## 2022-05-17 ENCOUNTER — Encounter: Payer: Self-pay | Admitting: Internal Medicine

## 2022-05-21 NOTE — Progress Notes (Unsigned)
05/22/22- 21 yo F never smoker for sleep evaluation with concern of OSA Medical problem list includes PSVT, IBS, Hidradenitis, ADHD, Anxiety, Depression,  Epworth score- Body weight today- Covid vax-

## 2022-05-22 ENCOUNTER — Ambulatory Visit (INDEPENDENT_AMBULATORY_CARE_PROVIDER_SITE_OTHER): Payer: Medicaid Other | Admitting: Internal Medicine

## 2022-05-22 ENCOUNTER — Encounter: Payer: Self-pay | Admitting: Internal Medicine

## 2022-05-22 VITALS — BP 118/72 | HR 87 | Temp 98.1°F | Ht 63.0 in | Wt 234.0 lb

## 2022-05-22 DIAGNOSIS — R635 Abnormal weight gain: Secondary | ICD-10-CM

## 2022-05-22 DIAGNOSIS — G479 Sleep disorder, unspecified: Secondary | ICD-10-CM | POA: Diagnosis not present

## 2022-05-22 DIAGNOSIS — G47411 Narcolepsy with cataplexy: Secondary | ICD-10-CM | POA: Diagnosis not present

## 2022-05-22 MED ORDER — AMPHETAMINE-DEXTROAMPHETAMINE 10 MG PO TABS: ORAL_TABLET | ORAL | 0 refills | Status: DC

## 2022-05-22 NOTE — Patient Instructions (Addendum)
Order- schedule at sleep center- Spilt night followed by MSLT  dx Narcolepsy  Important- do not take sedating meds like vistaril, or stimulants like adderall, or antidepressants like zoloft for 1 week before your sleep test   Script sent for adderall

## 2022-05-22 NOTE — Assessment & Plan Note (Signed)
Steady weight gain. Thyroid has been checked. May need help from an external support program.
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
## 2022-05-27 ENCOUNTER — Encounter: Payer: Self-pay | Admitting: Internal Medicine

## 2022-05-28 IMAGING — CT CT HEAD W/O CM
4 series · 17 of 47 positions shown, 19 images · non-contrast
Comparison: None.

CLINICAL DATA: Head injury, vomiting

EXAM:
CT HEAD WITHOUT CONTRAST
TECHNIQUE: Contiguous axial images were obtained from the base of the skull
through the vertex without intravenous contrast.

[Series 2: head wo · axial · 0.41mm/px · z∈[-70,+50]mm · 7 of 33 slices shown, 9 images]
[im 5/33  brain]
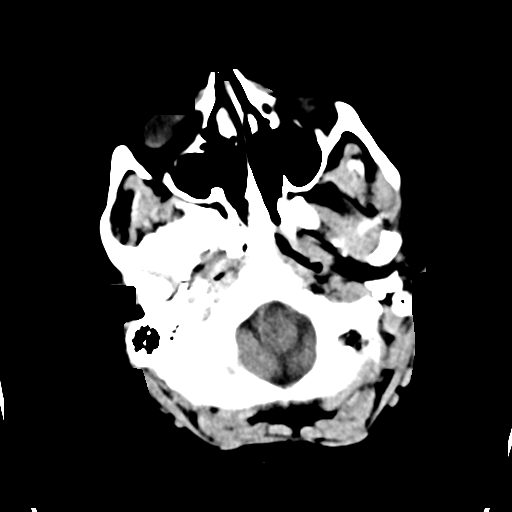
[im 5/33  bone]
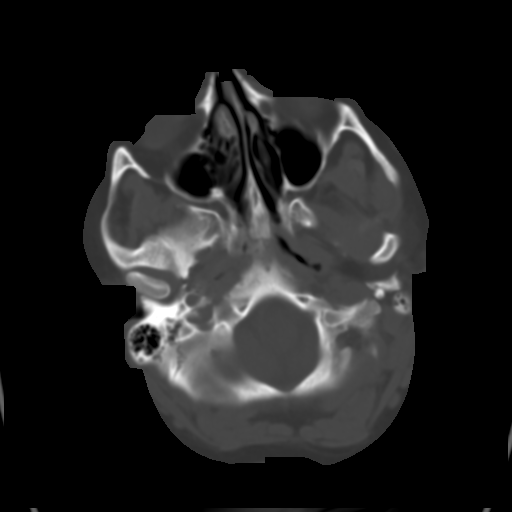
[im 9/33  brain]
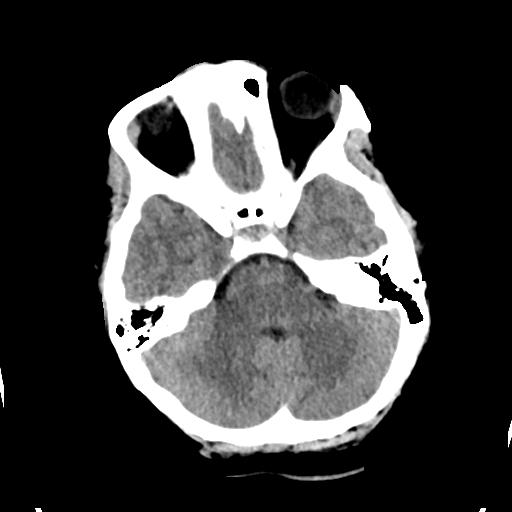
[im 13/33  brain]
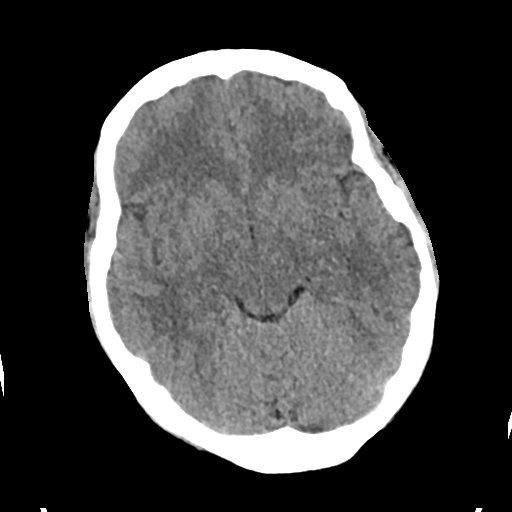
[im 17/33  brain]
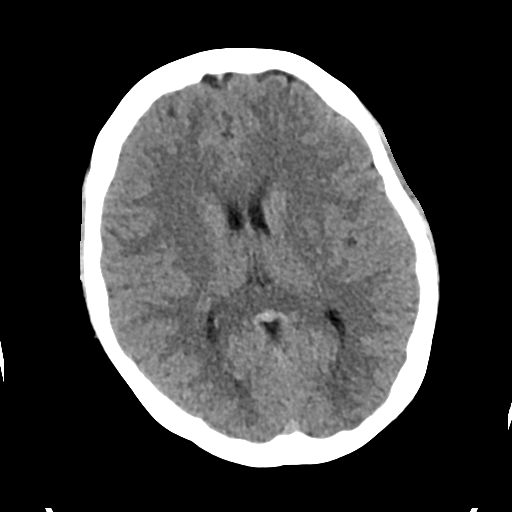
[im 21/33  brain]
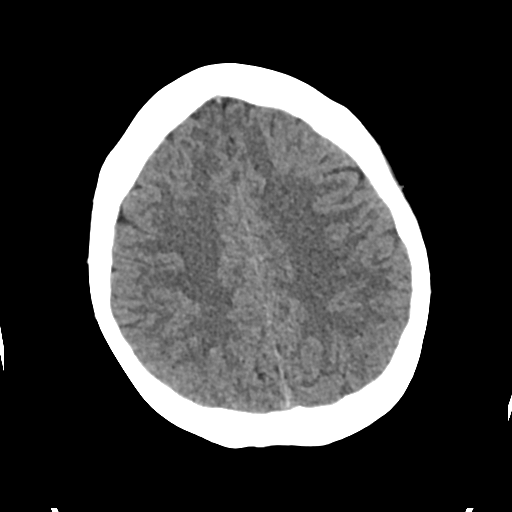
[im 21/33  bone]
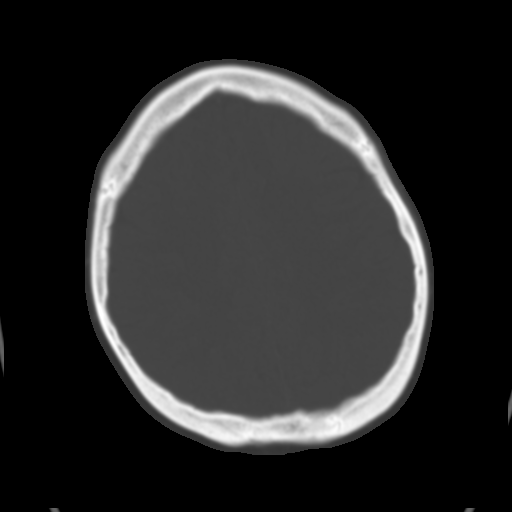
[im 25/33  brain]
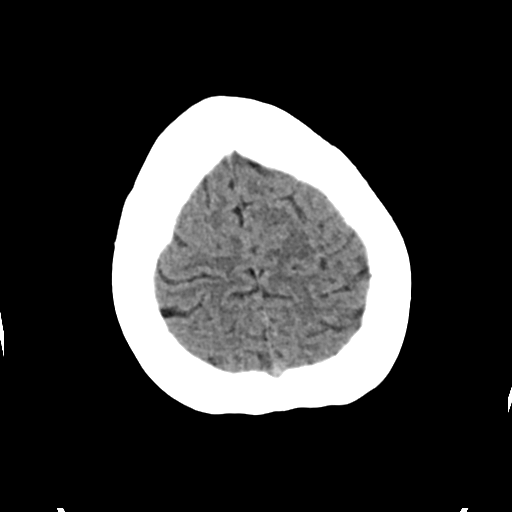
[im 29/33  brain]
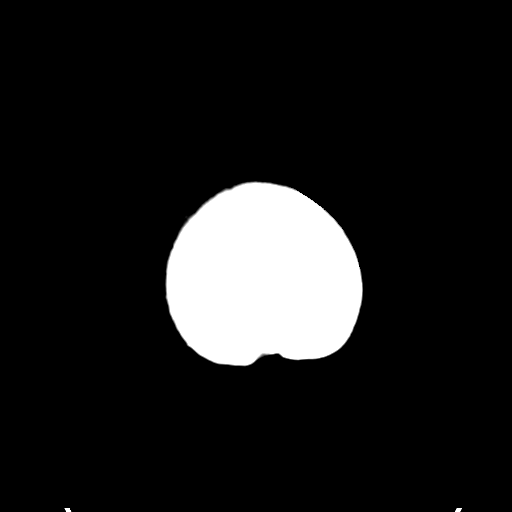

[Series 3: head bone · axial · 0.41mm/px · z∈[-74,-18]mm · 4 of 82 slices shown]
[im 9/82  bone]
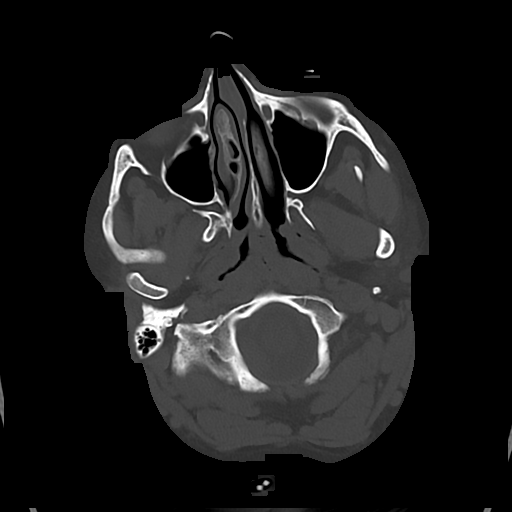
[im 17/82  bone]
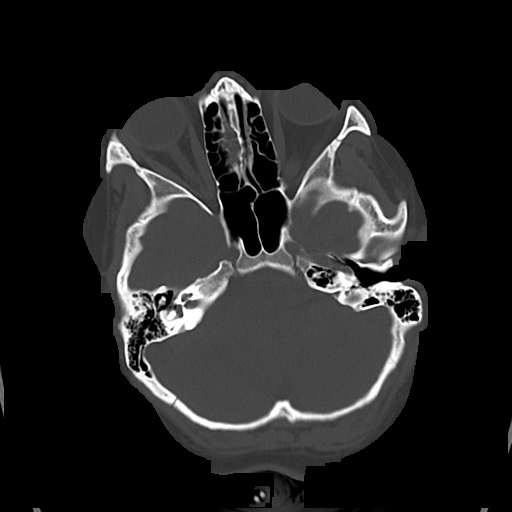
[im 25/82  bone]
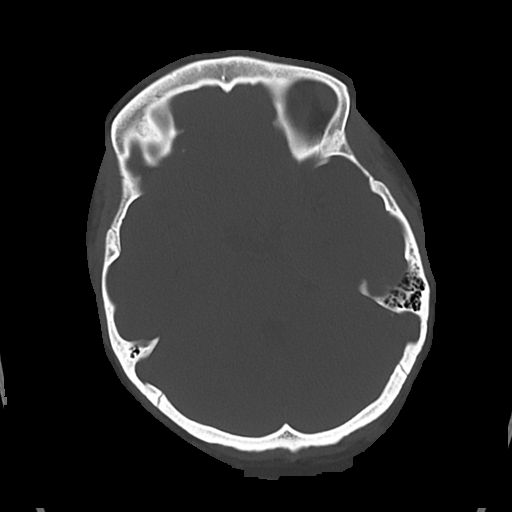
[im 37/82  bone]
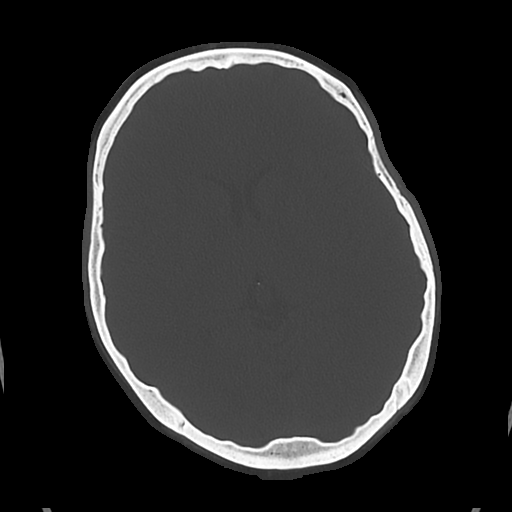

[Series 4: cor soft · coronal · 0.33mm/px · 3 of 74 slices shown]
[im 25/74  brain]
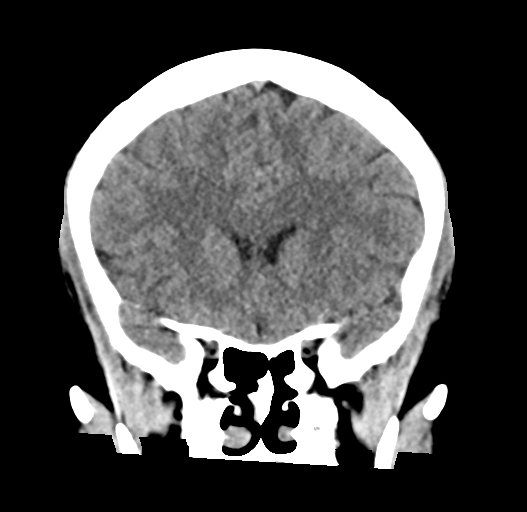
[im 33/74  brain]
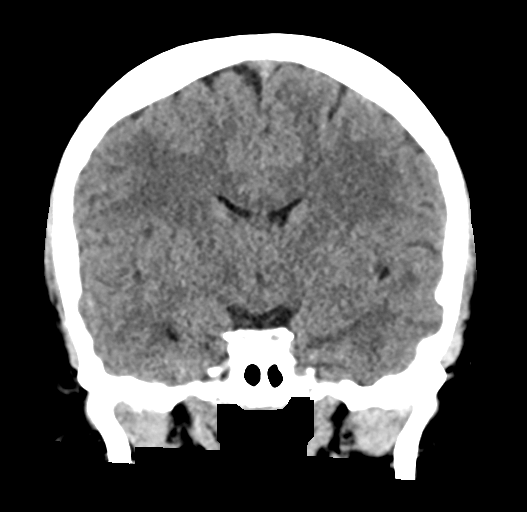
[im 41/74  brain]
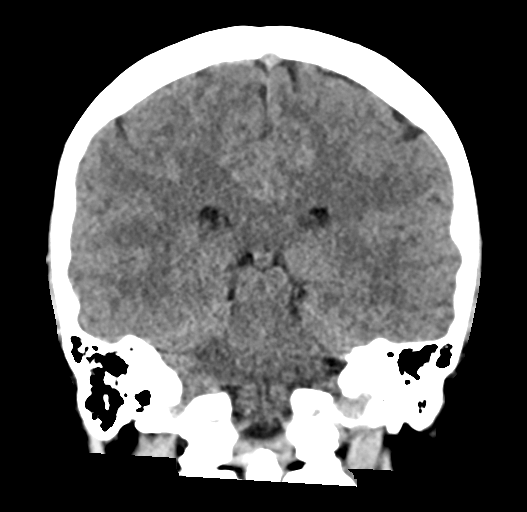

[Series 5: sag soft · sagittal · 0.33mm/px · 3 of 59 slices shown]
[im 20/59  brain]
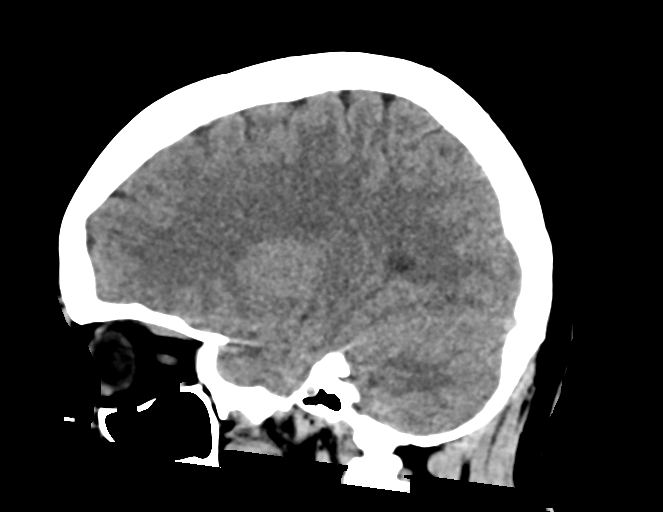
[im 30/59  brain]
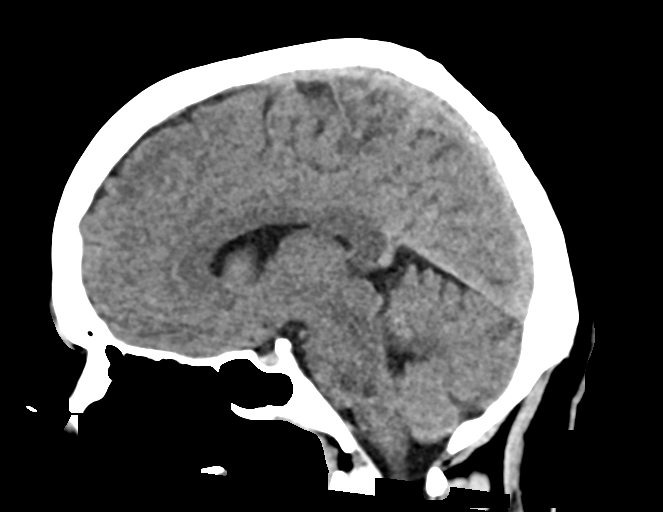
[im 39/59  brain]
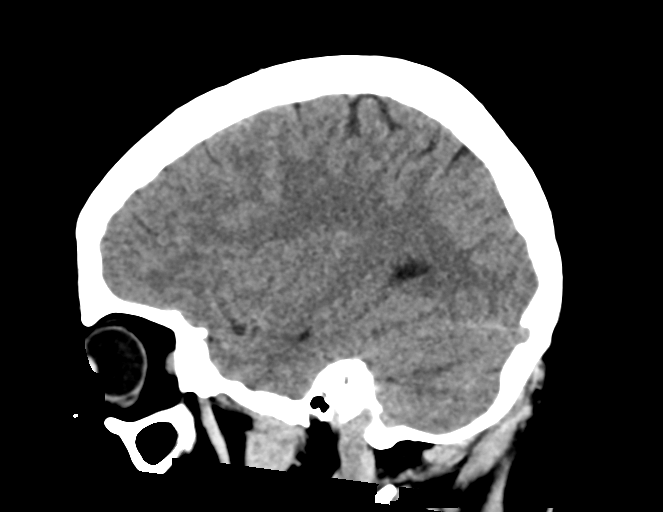

[17 of 47 positions shown; findings below may reference images not displayed]

FINDINGS: Brain: Normal anatomic configuration. No abnormal intra or
extra-axial mass lesion or fluid collection. No abnormal mass effect
or midline shift. No evidence of acute intracranial hemorrhage or
infarct. Ventricular size is normal. Cerebellum unremarkable.

Vascular: Unremarkable

Skull: Intact

Sinuses/Orbits: Paranasal sinuses are clear. Orbits are
unremarkable.

Other: Mastoid air cells and middle ear cavities are clear.
IMPRESSION: No acute intracranial abnormality.  Unremarkable examination.

## 2022-06-04 NOTE — Telephone Encounter (Signed)
Pt asking about the status of her MSLT Please help, thanks!

## 2022-06-09 ENCOUNTER — Telehealth: Payer: Medicaid Other | Admitting: Physician Assistant

## 2022-06-09 DIAGNOSIS — J020 Streptococcal pharyngitis: Secondary | ICD-10-CM

## 2022-06-09 MED ORDER — AMOXICILLIN 500 MG PO CAPS: 500.0000 mg | ORAL_CAPSULE | Freq: Two times a day (BID) | ORAL | 0 refills | Status: AC

## 2022-06-09 NOTE — Progress Notes (Signed)

## 2022-06-12 ENCOUNTER — Institutional Professional Consult (permissible substitution): Payer: Medicaid Other | Admitting: Internal Medicine

## 2022-06-16 ENCOUNTER — Other Ambulatory Visit: Payer: Self-pay | Admitting: Nurse Practitioner

## 2022-06-16 DIAGNOSIS — F411 Generalized anxiety disorder: Secondary | ICD-10-CM

## 2022-07-17 ENCOUNTER — Encounter (HOSPITAL_BASED_OUTPATIENT_CLINIC_OR_DEPARTMENT_OTHER): Payer: Self-pay | Admitting: Internal Medicine

## 2022-07-22 ENCOUNTER — Ambulatory Visit (HOSPITAL_BASED_OUTPATIENT_CLINIC_OR_DEPARTMENT_OTHER): Payer: PRIVATE HEALTH INSURANCE | Attending: Internal Medicine | Admitting: Internal Medicine

## 2022-07-22 VITALS — Ht 63.0 in | Wt 228.0 lb

## 2022-07-22 DIAGNOSIS — R0683 Snoring: Secondary | ICD-10-CM | POA: Diagnosis not present

## 2022-07-22 DIAGNOSIS — G47411 Narcolepsy with cataplexy: Secondary | ICD-10-CM | POA: Insufficient documentation

## 2022-07-23 ENCOUNTER — Other Ambulatory Visit: Payer: Self-pay | Admitting: Internal Medicine

## 2022-07-23 ENCOUNTER — Ambulatory Visit (HOSPITAL_BASED_OUTPATIENT_CLINIC_OR_DEPARTMENT_OTHER): Payer: PRIVATE HEALTH INSURANCE | Attending: Internal Medicine | Admitting: Internal Medicine

## 2022-07-23 DIAGNOSIS — G47411 Narcolepsy with cataplexy: Secondary | ICD-10-CM | POA: Insufficient documentation

## 2022-07-24 MED ORDER — AMPHETAMINE-DEXTROAMPHETAMINE 10 MG PO TABS: ORAL_TABLET | ORAL | 0 refills | Status: DC

## 2022-07-24 NOTE — Telephone Encounter (Signed)
Dr. Maple Hudson, please advise on refill request.  Allergies  Allergen Reactions   Ketorolac Tromethamine Palpitations   Gluten Meal    Reglan [Metoclopramide] Palpitations     Current Outpatient Medications:    amphetamine-dextroamphetamine (ADDERALL) 10 MG tablet, 1 twice daily as directed, Disp: 60 tablet, Rfl: 0   furosemide (LASIX) 20 MG tablet, Take 20 mg by mouth daily., Disp: , Rfl:    hydrOXYzine (VISTARIL) 25 MG capsule, TAKE 1 CAPSULE EVERY 8 HOURS AS NEEDED FOR ANXIETY, Disp: 60 capsule, Rfl: 1   metoprolol tartrate (LOPRESSOR) 25 MG tablet, TAKE 1/2 TABLET BY MOUTH TWICE DAILY, Disp: 90 tablet, Rfl: 3   omeprazole (PRILOSEC) 20 MG capsule, Take 1 capsule (20 mg total) by mouth daily., Disp: 90 capsule, Rfl: 3   ondansetron (ZOFRAN) 4 MG tablet, Take 1 tablet (4 mg total) by mouth every 8 (eight) hours as needed for nausea or vomiting., Disp: 15 tablet, Rfl: 1   sertraline (ZOLOFT) 50 MG tablet, Take 50 mg by mouth daily., Disp: , Rfl:

## 2022-07-24 NOTE — Telephone Encounter (Signed)
Adderall refilled

## 2022-07-27 DIAGNOSIS — G47411 Narcolepsy with cataplexy: Secondary | ICD-10-CM

## 2022-07-27 NOTE — Procedures (Signed)
    Patient Name: Dawn Mcpherson, Dawn Mcpherson Date: 07/22/2022 Gender: Female D.O.B: 18-Feb-2001 Age (years): 20 Referring Provider: Jetty Duhamel MD, ABSM Height (inches): 63 Interpreting Physician: Jetty Duhamel MD, ABSM Weight (lbs): 225 RPSGT: [place] BMI: 40 MRN: [id number] Neck Size: 14.00  CLINICAL INFORMATION Sleep Study Type: NPSG Indication for sleep study: Obesity, Snoring Epworth Sleepiness Score: 15  SLEEP STUDY TECHNIQUE As per the AASM Manual for the Scoring of Sleep and Associated Events v2.3 (April 2016) with a hypopnea requiring 4% desaturations.  The channels recorded and monitored were frontal, central and occipital EEG, electrooculogram (EOG), submentalis EMG (chin), nasal and oral airflow, thoracic and abdominal wall motion, anterior tibialis EMG, snore microphone, electrocardiogram, and pulse oximetry.  MEDICATIONS Medications self-administered by patient taken the night of the study : N/A  SLEEP ARCHITECTURE The study was initiated at 10:38:10 PM and ended at 5:59:11 AM.  Sleep onset time was 14.2 minutes and the sleep efficiency was 89.8%%. The total sleep time was 396 minutes.  Stage REM latency was 180.5 minutes.  The patient spent 4.3%% of the night in stage N1 sleep, 68.4%% in stage N2 sleep, 15.8%% in stage N3 and 11.5% in REM.  Alpha intrusion was absent.  Supine sleep was 33.54%.  RESPIRATORY PARAMETERS The overall apnea/hypopnea index (AHI) was 0.2 per hour. There were 1 total apneas, including 0 obstructive, 1 central and 0 mixed apneas. There were 0 hypopneas and 3 RERAs.  The AHI during Stage REM sleep was 1.3 per hour.  AHI while supine was 0.0 per hour.  The mean oxygen saturation was 96.3%. The minimum SpO2 during sleep was 87.0%.  soft snoring was noted during this study.  CARDIAC DATA The 2 lead EKG demonstrated sinus rhythm. The mean heart rate was 77.0 beats per minute. Other EKG findings include: None.  LEG MOVEMENT  DATA The total PLMS were 0 with a resulting PLMS index of 0.0. Associated arousal with leg movement index was 0.9 .  IMPRESSIONS - No significant obstructive sleep apnea occurred during this study (AHI = 0.2/h). - No significant central sleep apnea occurred during this study (CAI = 0.2/h). - Mild oxygen desaturation was noted during this study (Min O2 = 87.0%). Mean 96.3% - The patient snored with soft snoring volume. - No cardiac abnormalities were noted during this study. - Clinically significant periodic limb movements did not occur during sleep. No significant associated arousals. - Total sleep time 396 minutes, sleep efficiency 89.8%, REM latency 180.5 minutes, REM % of total sleep time 11.5%.  DIAGNOSIS - Normal  RECOMMENDATIONS - See result of MSLT following this test. - Sleep hygiene should be reviewed to assess factors that may improve sleep quality. - Weight management and regular exercise should be initiated or continued if appropriate.  [Electronically signed] 07/27/2022 11:39 AM  Jetty Duhamel MD, ABSM Diplomate, American Board of Sleep Medicine NPI: [id number]                         Jetty Duhamel Diplomate, American Board of Sleep Medicine  ELECTRONICALLY SIGNED ON:  07/27/2022, 11:35 AM [facility] PH: [phone]   FX: [phone] ACCREDITED BY THE AMERICAN ACADEMY OF SLEEP MEDICINE

## 2022-07-27 NOTE — Procedures (Signed)
    Patient Name: Dawn Mcpherson, Dawn Mcpherson Date: 07/23/2022 Gender: Female D.O.B: 01-30-2001 Age (years): 20 Referring Provider: Jetty Duhamel MD, ABSM Height (inches): 63 Interpreting Physician: Jetty Duhamel MD, ABSM Weight (lbs): 225 RPSGT: [place] BMI: 40 MRN: [id number] Neck Size: 14.00  CLINICAL INFORMATION Sleep Study Type: MSLT The patient was referred to the sleep center for evaluation of daytime sleepiness. Epworth Sleepiness Score: 15 NPSG done 07/22/22  SLEEP STUDY TECHNIQUE A Multiple Sleep Latency Test was performed after an overnight polysomnogram according to the AASM scoring manual v2.3 (April 2016) and clinical guidelines. Five nap opportunities occurred over the course of the test which followed an overnight polysomnogram. The channels recorded and monitored were frontal, central, and occipital electroencephalography (EEG), right and left electrooculogram (EOG), chin electromyography (EMG), and electrocardiogram (EKG).  MEDICATIONS Medications taken by the patient : None per protocol Medications administered by patient during sleep study : No sleep medicine administered.  IMPRESSIONS - Total number of naps attempted: 5 . Total number of naps with sleep attained: 5. The Mean Sleep Latency was 0.03 minutes. There was 1 sleep-onset REM period. - The patient appears to have pathologic sleepiness, evidenced by a short mean sleep latency (8 minutes or less) on this MSLT. - Standard criteria for a firm diagnosis of Narcolepsy call for evidence of increased REM pressure, usually including REM during at least 2 of 5 naps. Narcolepsy pattern may progress initially, during maturation.  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 - Return for follow up to evaluate other causes of excessive daytime  sleepiness.  [Electronically signed] 07/27/2022 11:49 AM  Jetty Duhamel MD, ABSM Diplomate, American Board of Sleep Medicine NPI: [id number]                         Jetty Duhamel Diplomate, American Board of Sleep Medicine  ELECTRONICALLY SIGNED ON:  07/27/2022, 11:43 AM [facility] PH: [phone]   FX: [phone] ACCREDITED BY THE AMERICAN ACADEMY OF SLEEP MEDICINE

## 2022-07-29 ENCOUNTER — Ambulatory Visit (INDEPENDENT_AMBULATORY_CARE_PROVIDER_SITE_OTHER): Payer: Medicaid Other | Admitting: Nurse Practitioner

## 2022-07-29 ENCOUNTER — Encounter: Payer: Self-pay | Admitting: Nurse Practitioner

## 2022-07-29 VITALS — BP 115/80 | HR 76 | Ht 63.0 in | Wt 231.0 lb

## 2022-07-29 DIAGNOSIS — Z0001 Encounter for general adult medical examination with abnormal findings: Secondary | ICD-10-CM | POA: Diagnosis not present

## 2022-07-29 DIAGNOSIS — L732 Hidradenitis suppurativa: Secondary | ICD-10-CM

## 2022-07-29 DIAGNOSIS — I471 Supraventricular tachycardia: Secondary | ICD-10-CM

## 2022-07-29 DIAGNOSIS — N926 Irregular menstruation, unspecified: Secondary | ICD-10-CM | POA: Diagnosis not present

## 2022-07-29 DIAGNOSIS — Z0000 Encounter for general adult medical examination without abnormal findings: Secondary | ICD-10-CM | POA: Insufficient documentation

## 2022-07-29 DIAGNOSIS — Z020 Encounter for examination for admission to educational institution: Secondary | ICD-10-CM | POA: Diagnosis not present

## 2022-07-29 DIAGNOSIS — N912 Amenorrhea, unspecified: Secondary | ICD-10-CM | POA: Insufficient documentation

## 2022-07-29 LAB — POCT URINE PREGNANCY: Preg Test, Ur: NEGATIVE

## 2022-07-29 MED ORDER — DOXYCYCLINE HYCLATE 100 MG PO TABS: 100.0000 mg | ORAL_TABLET | Freq: Two times a day (BID) | ORAL | 0 refills | Status: DC

## 2022-07-29 NOTE — Assessment & Plan Note (Signed)
Chronic condition  Rashes noted on right breast , right axillary and under left breast , the rashes on right bread appears red and inflamed. RX doxycycline 100mg  BID for 10 days  She was advised to loose weight  Will refer to dermatology if symptoms does not improve.

## 2022-07-29 NOTE — Assessment & Plan Note (Signed)
Annual exam as documented.  Counseling done include healthy lifestyle involving committing to 150 minutes of exercise per week, heart healthy diet, and attaining healthy weight. The importance of adequate sleep also discussed.  Regular use of seat belt and home safety were also discussed . Changes in health habits are decided on by patient with goals and time frames set for achieving them.

## 2022-07-29 NOTE — Progress Notes (Signed)
New Patient Office Visit  Subjective    Patient ID: Dawn Mcpherson, female    DOB: [birth year]  Age: 21 y.o. MRN: [id number]  CC:  Chief Complaint  Patient presents with   Annual Exam    cpe    HPI Dawn Mcpherson with PMH of  PSVT, ADHD, Hidradenitis suppurativa presents for CPE  She generally feels good, has been following up with cardiology for her PSVT. Has rashes on her right breast.   Patient stated that her menstrual period is late for 20 days , she has done home pregnancy test but they were negative, she is sexually active and not on birth control.   Needs Hep B titer for school and TB skin test.   She declines HIV, Hepc C labs today    Outpatient Encounter Medications as of 07/29/2022  Medication Sig   amphetamine-dextroamphetamine (ADDERALL) 10 MG tablet 1 twice daily as directed   doxycycline (VIBRA-TABS) 100 MG tablet Take 1 tablet (100 mg total) by mouth 2 (two) times daily.   furosemide (LASIX) 20 MG tablet Take 20 mg by mouth daily.   hydrOXYzine (VISTARIL) 25 MG capsule TAKE 1 CAPSULE EVERY 8 HOURS AS NEEDED FOR ANXIETY   metoprolol tartrate (LOPRESSOR) 25 MG tablet TAKE 1/2 TABLET BY MOUTH TWICE DAILY   omeprazole (PRILOSEC) 20 MG capsule Take 1 capsule (20 mg total) by mouth daily.   ondansetron (ZOFRAN) 4 MG tablet Take 1 tablet (4 mg total) by mouth every 8 (eight) hours as needed for nausea or vomiting.   sertraline (ZOLOFT) 50 MG tablet Take 50 mg by mouth daily.   No facility-administered encounter medications on file as of 07/29/2022.    Past Medical History:  Diagnosis Date   Anxiety    Depression    Depression    Phreesia 03/06/2021   Depression    Phreesia 04/30/2021   Headache(784.0)    IBS (irritable bowel syndrome)     Past Surgical History:  Procedure Laterality Date   COLONOSCOPY WITH ESOPHAGOGASTRODUODENOSCOPY (EGD)  07/2020   [facility], Dr. Clinton Sawyer   INCISION AND DRAINAGE      Family History  Problem Relation Age of Onset    Hypertension Mother    Depression Mother    Diabetes Father    Healthy Sister    Healthy Sister    Healthy Sister    Multiple sclerosis Maternal Grandmother    Diabetes Maternal Grandfather    Thyroid disease Maternal Grandfather    Breast cancer Paternal Grandmother    Bladder Cancer Paternal Grandfather    Diabetes Paternal Grandfather    Leukemia Paternal Grandfather     Social History   Socioeconomic History   Marital status: Single    Spouse name: Not on file   Number of children: 0   Years of education: Not on file   Highest education level: Not on file  Occupational History   Occupation: [employer]    Comment: Programme researcher, broadcasting/film/video  Tobacco Use   Smoking status: Never    Passive exposure: Yes   Smokeless tobacco: Never  Vaping Use   Vaping Use: Never used  Substance and Sexual Activity   Alcohol use: No   Drug use: No   Sexual activity: Yes    Birth control/protection: None  Other Topics Concern   Not on file  Social History Narrative   Lives with both parents.    Social Determinants of Health   Financial Resource Strain: Not on file  Food Insecurity:  Not on file  Transportation Needs: Not on file  Physical Activity: Not on file  Stress: Not on file  Social Connections: Not on file  Intimate Partner Violence: Not on file    Review of Systems  Constitutional: Negative.  Negative for chills and fever.  HENT:  Negative for ear pain, hearing loss and tinnitus.   Eyes: Negative.  Negative for blurred vision and double vision.  Respiratory: Negative.  Negative for cough and hemoptysis.   Cardiovascular: Negative.  Negative for chest pain, palpitations and orthopnea.  Gastrointestinal: Negative.  Negative for heartburn, nausea and vomiting.  Genitourinary: Negative.  Negative for dysuria and urgency.  Musculoskeletal:  Negative for myalgias and neck pain.  Skin:  Positive for rash. Negative for itching.  Neurological: Negative.  Negative for dizziness,  tingling and headaches.  Endo/Heme/Allergies: Negative.  Negative for environmental allergies. Does not bruise/bleed easily.  Psychiatric/Behavioral: Negative.  Negative for depression, hallucinations, substance abuse and suicidal ideas.         Objective    BP 115/80 (BP Location: Right Arm, Patient Position: Sitting, Cuff Size: Normal)   Pulse 76   Ht 5\' 3"  (1.6 m)   Wt 231 lb (104.8 kg)   LMP 06/13/2022 (Approximate)   SpO2 98%   BMI 40.92 kg/m   Physical Exam Vitals and nursing note reviewed. Exam conducted with a chaperone present.  Constitutional:      General: She is not in acute distress.    Appearance: She is obese. She is not ill-appearing, toxic-appearing or diaphoretic.  HENT:     Head: Normocephalic and atraumatic.     Right Ear: Tympanic membrane, ear canal and external ear normal. There is no impacted cerumen.     Left Ear: Tympanic membrane, ear canal and external ear normal. There is no impacted cerumen.     Nose: No congestion or rhinorrhea.     Mouth/Throat:     Mouth: Mucous membranes are moist.     Pharynx: Oropharynx is clear. No oropharyngeal exudate or posterior oropharyngeal erythema.  Eyes:     General: No scleral icterus.       Right eye: No discharge.        Left eye: No discharge.     Extraocular Movements: Extraocular movements intact.     Conjunctiva/sclera: Conjunctivae normal.     Pupils: Pupils are equal, round, and reactive to light.  Neck:     Vascular: No carotid bruit.  Cardiovascular:     Rate and Rhythm: Normal rate and regular rhythm.     Pulses: Normal pulses.     Heart sounds: Normal heart sounds. No murmur heard.    No friction rub. No gallop.  Pulmonary:     Effort: Pulmonary effort is normal. No respiratory distress.     Breath sounds: Normal breath sounds. No stridor. No wheezing, rhonchi or rales.  Chest:     Chest wall: No mass, lacerations, deformity, swelling, tenderness, crepitus or edema.  Breasts:    Tanner  Score is 5.     Right: Skin change present. No swelling, bleeding, inverted nipple, mass, nipple discharge or tenderness.     Left: Skin change present. No swelling, bleeding, inverted nipple, mass, nipple discharge or tenderness.     Comments: Ulcer noted on right breast , scaring from  previous HS under right arm and left breast  Abdominal:     General: There is no distension.     Palpations: Abdomen is soft. There is no mass.  Tenderness: There is no abdominal tenderness. There is no right CVA tenderness, left CVA tenderness, guarding or rebound.     Hernia: No hernia is present.  Musculoskeletal:        General: No swelling, tenderness, deformity or signs of injury.     Cervical back: Normal range of motion and neck supple. No rigidity or tenderness.     Right lower leg: No edema.     Left lower leg: No edema.  Lymphadenopathy:     Cervical: No cervical adenopathy.     Upper Body:     Right upper body: No supraclavicular, axillary or pectoral adenopathy.     Left upper body: No supraclavicular, axillary or pectoral adenopathy.  Skin:    Capillary Refill: Capillary refill takes less than 2 seconds.     Coloration: Skin is not jaundiced or pale.     Findings: Erythema and rash present. No bruising or lesion.  Neurological:     Mental Status: She is alert and oriented to person, place, and time.     Cranial Nerves: No cranial nerve deficit.     Sensory: No sensory deficit.     Motor: No weakness.     Coordination: Coordination normal.     Gait: Gait normal.     Deep Tendon Reflexes: Reflexes normal.  Psychiatric:        Mood and Affect: Mood normal.        Behavior: Behavior normal.        Thought Content: Thought content normal.        Judgment: Judgment normal.         Assessment & Plan:   Problem List Items Addressed This Visit       Cardiovascular and Mediastinum   PSVT (paroxysmal supraventricular tachycardia) (HCC)    Well controlled on metoprolol normal  heart rate and rhythm today  Encouraged to maintain close follow up with cardiology         Musculoskeletal and Integument   Hidradenitis suppurativa    Chronic condition  Rashes noted on right breast , right axillary and under left breast , the rashes on right bread appears red and inflamed. RX doxycycline 100mg  BID for 10 days  She was advised to loose weight  Will refer to dermatology if symptoms does not improve.      Relevant Medications   doxycycline (VIBRA-TABS) 100 MG tablet     Other   Annual physical exam - Primary    Annual exam as documented.  Counseling done include healthy lifestyle involving committing to 150 minutes of exercise per week, heart healthy diet, and attaining healthy weight. The importance of adequate sleep also discussed.  Regular use of seat belt and home safety were also discussed . Changes in health habits are decided on by patient with goals and time frames set for achieving them.       Relevant Orders   CBC with Differential   TSH   HgB A1c   Lipid Profile   Vitamin D (25 hydroxy)   Missed period     urine pregnancy test today       Relevant Orders   POCT urine pregnancy (Completed)   School health examination   Relevant Orders   HBsAb Quant HBIG Assessment   TB Skin Test (Completed)    Return in about 6 months (around 01/29/2023) for anxiety and depression .   03/30/2023, FNP

## 2022-07-29 NOTE — Progress Notes (Signed)
I have ordered doxycycline 100mg  BID for 10 days , take for Hidradenitis S.

## 2022-07-29 NOTE — Patient Instructions (Addendum)
Please get your TDAP at the pharmacy as discussed   Needs TB skin test, urine pregnancy test   It is important that you exercise regularly at least 30 minutes 5 times a week.  Think about what you will eat, plan ahead. Choose " clean, green, fresh or frozen" over canned, processed or packaged foods which are more sugary, salty and fatty. 70 to 75% of food eaten should be vegetables and fruit. Three meals at set times with snacks allowed between meals, but they must be fruit or vegetables. Aim to eat over a 12 hour period , example 7 am to 7 pm, and STOP after  your last meal of the day. Drink water,generally about 64 ounces per day, no other drink is as healthy. Fruit juice is best enjoyed in a healthy way, by EATING the fruit.  Thanks for choosing [facility], we consider it a privelige to serve you.

## 2022-07-29 NOTE — Progress Notes (Signed)
Negative pregnancy test

## 2022-07-29 NOTE — Assessment & Plan Note (Signed)
urine pregnancy test today

## 2022-07-29 NOTE — Assessment & Plan Note (Signed)
Well controlled on metoprolol normal heart rate and rhythm today  Encouraged to maintain close follow up with cardiology

## 2022-07-30 ENCOUNTER — Telehealth: Payer: Self-pay | Admitting: Nurse Practitioner

## 2022-07-30 ENCOUNTER — Other Ambulatory Visit: Payer: Self-pay | Admitting: Nurse Practitioner

## 2022-07-30 DIAGNOSIS — D72820 Lymphocytosis (symptomatic): Secondary | ICD-10-CM

## 2022-07-30 LAB — LIPID PANEL
Chol/HDL Ratio: 4.8 ratio — ABNORMAL HIGH (ref 0.0–4.4)
Cholesterol, Total: 174 mg/dL (ref 100–199)
HDL: 36 mg/dL — ABNORMAL LOW (ref >39)
LDL Chol Calc (NIH): 119 mg/dL — ABNORMAL HIGH (ref 0–99)
Triglycerides: 101 mg/dL (ref 0–149)
VLDL Cholesterol Cal: 19 mg/dL (ref 5–40)

## 2022-07-30 LAB — CBC WITH DIFFERENTIAL/PLATELET
Basophils Absolute: 0.1 10*3/uL (ref 0.0–0.2)
Basos: 0 %
EOS (ABSOLUTE): 0.1 10*3/uL (ref 0.0–0.4)
Eos: 1 %
Hematocrit: 41.2 % (ref 34.0–46.6)
Hemoglobin: 14 g/dL (ref 11.1–15.9)
Immature Grans (Abs): 0.1 10*3/uL (ref 0.0–0.1)
Immature Granulocytes: 1 %
Lymphocytes Absolute: 3.8 10*3/uL — ABNORMAL HIGH (ref 0.7–3.1)
Lymphs: 30 %
MCH: 28.1 pg (ref 26.6–33.0)
MCHC: 34 g/dL (ref 31.5–35.7)
MCV: 83 fL (ref 79–97)
Monocytes Absolute: 0.6 10*3/uL (ref 0.1–0.9)
Monocytes: 5 %
Neutrophils Absolute: 8 10*3/uL — ABNORMAL HIGH (ref 1.4–7.0)
Neutrophils: 63 %
Platelets: 377 10*3/uL (ref 150–450)
RBC: 4.99 x10E6/uL (ref 3.77–5.28)
RDW: 13.1 % (ref 11.7–15.4)
WBC: 12.5 10*3/uL — ABNORMAL HIGH (ref 3.4–10.8)

## 2022-07-30 LAB — HEMOGLOBIN A1C
Est. average glucose Bld gHb Est-mCnc: 97 mg/dL
Hgb A1c MFr Bld: 5 % (ref 4.8–5.6)

## 2022-07-30 LAB — VITAMIN D 25 HYDROXY (VIT D DEFICIENCY, FRACTURES): Vit D, 25-Hydroxy: 26.7 ng/mL — ABNORMAL LOW (ref 30.0–100.0)

## 2022-07-30 LAB — TSH: TSH: 2.07 u[IU]/mL (ref 0.450–4.500)

## 2022-07-30 LAB — HBSAB QUANT HBIG ASSESSMENT: HBsAb Quant HBIG Assessment: <3.1 m[IU]/mL

## 2022-07-30 NOTE — Progress Notes (Signed)
I have ordered doxycycline 100 mg twice daily for her hidradenitis,  Hyperlipidemia Eat a healthy diet, including lots of fruits and vegetables. Avoid foods with a lot of saturated and trans fats, such as red meat, butter, fried foods and cheese . Maintain a healthy weight. Vitamin D deficiency.  Take vitamin D 1000 units daily  Patient is not immune to hep B virus, she should get the hep B vaccine at the pharmacy.

## 2022-07-30 NOTE — Telephone Encounter (Signed)
Pt called stating she is wanting to know if she can please be referred to hematology due to her lab results. She is asking to see Dr. Arlan Organ with [facility]. Pt asked if you could please call her when the referral is processed?

## 2022-07-30 NOTE — Progress Notes (Unsigned)
Amb hematology

## 2022-07-30 NOTE — Telephone Encounter (Signed)
Spoke with pt advised referral has been placed

## 2022-07-30 NOTE — Telephone Encounter (Signed)
Please advise 

## 2022-07-31 ENCOUNTER — Encounter: Payer: Self-pay | Admitting: Nurse Practitioner

## 2022-07-31 LAB — TB SKIN TEST
Induration: 0 mm
TB Skin Test: NEGATIVE

## 2022-08-04 ENCOUNTER — Encounter: Payer: Self-pay | Admitting: Internal Medicine

## 2022-08-06 ENCOUNTER — Encounter: Payer: Self-pay | Admitting: Family

## 2022-08-06 ENCOUNTER — Inpatient Hospital Stay (HOSPITAL_BASED_OUTPATIENT_CLINIC_OR_DEPARTMENT_OTHER): Payer: PRIVATE HEALTH INSURANCE | Admitting: Family

## 2022-08-06 ENCOUNTER — Inpatient Hospital Stay: Payer: PRIVATE HEALTH INSURANCE | Attending: Hematology & Oncology

## 2022-08-06 ENCOUNTER — Other Ambulatory Visit: Payer: Self-pay | Admitting: Family

## 2022-08-06 VITALS — BP 111/73 | HR 68 | Temp 98.7°F | Resp 17 | Ht 63.0 in | Wt 228.4 lb

## 2022-08-06 DIAGNOSIS — D72829 Elevated white blood cell count, unspecified: Secondary | ICD-10-CM

## 2022-08-06 DIAGNOSIS — D72820 Lymphocytosis (symptomatic): Secondary | ICD-10-CM | POA: Diagnosis present

## 2022-08-06 DIAGNOSIS — Z8052 Family history of malignant neoplasm of bladder: Secondary | ICD-10-CM | POA: Diagnosis not present

## 2022-08-06 DIAGNOSIS — R0789 Other chest pain: Secondary | ICD-10-CM | POA: Insufficient documentation

## 2022-08-06 DIAGNOSIS — F419 Anxiety disorder, unspecified: Secondary | ICD-10-CM | POA: Insufficient documentation

## 2022-08-06 DIAGNOSIS — R002 Palpitations: Secondary | ICD-10-CM | POA: Insufficient documentation

## 2022-08-06 DIAGNOSIS — Z806 Family history of leukemia: Secondary | ICD-10-CM | POA: Insufficient documentation

## 2022-08-06 DIAGNOSIS — Z803 Family history of malignant neoplasm of breast: Secondary | ICD-10-CM | POA: Insufficient documentation

## 2022-08-06 LAB — CMP (CANCER CENTER ONLY)
ALT: 18 U/L (ref 0–44)
AST: 14 U/L — ABNORMAL LOW (ref 15–41)
Albumin: 4.6 g/dL (ref 3.5–5.0)
Alkaline Phosphatase: 58 U/L (ref 38–126)
Anion gap: 7 (ref 5–15)
BUN: 10 mg/dL (ref 6–20)
CO2: 28 mmol/L (ref 22–32)
Calcium: 9.7 mg/dL (ref 8.9–10.3)
Chloride: 107 mmol/L (ref 98–111)
Creatinine: 0.91 mg/dL (ref 0.44–1.00)
GFR, Estimated: >60 mL/min (ref >60)
Glucose, Bld: 93 mg/dL (ref 70–99)
Potassium: 4.1 mmol/L (ref 3.5–5.1)
Sodium: 142 mmol/L (ref 135–145)
Total Bilirubin: 0.7 mg/dL (ref 0.3–1.2)
Total Protein: 7.5 g/dL (ref 6.5–8.1)

## 2022-08-06 LAB — CBC WITH DIFFERENTIAL (CANCER CENTER ONLY)
Abs Immature Granulocytes: 0.03 10*3/uL (ref 0.00–0.07)
Basophils Absolute: 0.1 10*3/uL (ref 0.0–0.1)
Basophils Relative: 1 %
Eosinophils Absolute: 0.1 10*3/uL (ref 0.0–0.5)
Eosinophils Relative: 1 %
HCT: 42 % (ref 36.0–46.0)
Hemoglobin: 14.1 g/dL (ref 12.0–15.0)
Immature Granulocytes: 0 %
Lymphocytes Relative: 37 %
Lymphs Abs: 3.1 10*3/uL (ref 0.7–4.0)
MCH: 27.6 pg (ref 26.0–34.0)
MCHC: 33.6 g/dL (ref 30.0–36.0)
MCV: 82.2 fL (ref 80.0–100.0)
Monocytes Absolute: 0.5 10*3/uL (ref 0.1–1.0)
Monocytes Relative: 6 %
Neutro Abs: 4.7 10*3/uL (ref 1.7–7.7)
Neutrophils Relative %: 55 %
Platelet Count: 359 10*3/uL (ref 150–400)
RBC: 5.11 MIL/uL (ref 3.87–5.11)
RDW: 12.6 % (ref 11.5–15.5)
WBC Count: 8.4 10*3/uL (ref 4.0–10.5)
nRBC: 0 % (ref 0.0–0.2)

## 2022-08-06 LAB — LACTATE DEHYDROGENASE: LDH: 185 U/L (ref 98–192)

## 2022-08-06 LAB — SAVE SMEAR(SSMR), FOR PROVIDER SLIDE REVIEW

## 2022-08-06 NOTE — Progress Notes (Signed)
Hematology/Oncology Consultation   Name: Dawn Mcpherson      MRN: [id number]    Location: Room/bed info not found  Date: 08/06/2022 Time:11:17 AM   REFERRING PHYSICIAN:  Folashade R. Paseda, FNP  REASON FOR CONSULT: Lymphocytosis    DIAGNOSIS: Mild intermittent lymphocytosis   HISTORY OF PRESENT ILLNESS: Dawn Mcpherson is a very pleasant 21 yo caucasian female with year long history of intermittent elevated WBC count.  WBC count today has resolved at 8.4 and differential is unremarkable.  Hgb 14.1, MCV 82 and platelet count 359.  She finished a round of doxycycline yesterday for hidradenitis infection under both breasts and right axilla.  Her father smokes and she is exposed 2nd hand.  She does not smoke, use recreational drugs or drink ETOH.  She states that in November 2022 she developed intermittent chest pain and palpitations was initially diagnosed with PSVT. Metoprolol was started and did not seem to help. She went to see cardiologist Dr. Sherren Kerns and in March they attempted to do an ablation but she was having episodes of PAT and PAF at the time so this was cancelled. They discussed a PV ablation. She was sent for sleep study which was negative. Her follow-up with that office is at the end of this month.  She states that she is still taking the Metoprolol but does not feel that it helps.   She states that her symptoms have progressed to include the following:  - fatigue  - falling asleep soon after she sits down - intermittent sharp chest pain - intermittent palpitations - intermittent shooting pain and numbness down the left shoulder and arm - dizziness - random fever ranging 100-101 - soreness all over - red facial rash across her cheeks and nose - fluid retention in the face, neck and lower extremities (taking lasix 20 mg PO daily) - numbness and tingling in her feet with cold toes - occasional abdominal bloating - hot flashes and night sweats  - 22 lb weight gain since March  2023  No adenopathy or lymphedema noted on her exam.  She does have a lot of anxiety about her current health issues.  Her father also has issues with elevated WBC count with infection (pneumonia and heavy smoker).  She has had Covid 3 separate times with the most recent being in July 2022. She is 30 days late for her cycle to start. Home pregnancy test and test with PCP last week were all negative. Her cycle is typically regular with heavy flow.  No other blood loss noted. No bruising or petechiae.  No history of diabetes or thyroid disease.  No personal history of cancer. Paternal grandfather had leukemia, prostate and bladder cancer and maternal grandmother had breast.  No chills, n/v, cough, abdominal pain or changes in bowel or bladder habits.  Appetite and hydration are good.  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   ROS: All other 10 point review of systems is negative.   PAST MEDICAL HISTORY:   Past Medical History:  Diagnosis Date   Anxiety    Depression    Depression    [employer]reesia 03/06/2021   Depression    [employer]ia 04/30/2021   Headache(784.0)    IBS (irritable bowel syndrome)     ALLERGIES: Allergies  Allergen Reactions   Ketorolac Tromethamine Palpitations   Gluten Meal    Reglan [Metoclopramide] Palpitations      MEDICATIONS:  Current Outpatient Medications  on File Prior to Visit  Medication Sig Dispense Refill   amphetamine-dextroamphetamine (ADDERALL) 10 MG tablet 1 twice daily as directed 60 tablet 0   furosemide (LASIX) 20 MG tablet Take 20 mg by mouth daily.     hydrOXYzine (VISTARIL) 25 MG capsule TAKE 1 CAPSULE EVERY 8 HOURS AS NEEDED FOR ANXIETY 60 capsule 1   metoprolol tartrate (LOPRESSOR) 25 MG tablet TAKE 1/2 TABLET BY MOUTH TWICE DAILY 90 tablet 3   omeprazole (PRILOSEC) 20 MG capsule Take 1 capsule (20 mg total) by mouth daily. 90 capsule 3   ondansetron (ZOFRAN) 4 MG tablet Take 1 tablet (4 mg  total) by mouth every 8 (eight) hours as needed for nausea or vomiting. 15 tablet 1   sertraline (ZOLOFT) 50 MG tablet Take 50 mg by mouth daily.     doxycycline (VIBRA-TABS) 100 MG tablet Take 1 tablet (100 mg total) by mouth 2 (two) times daily. (Patient not taking: Reported on 08/06/2022) 20 tablet 0   No current facility-administered medications on file prior to visit.     PAST SURGICAL HISTORY Past Surgical History:  Procedure Laterality Date   COLONOSCOPY WITH ESOPHAGOGASTRODUODENOSCOPY (EGD)  07/2020   [facility], Dr. Clinton Sawyer   INCISION AND DRAINAGE      FAMILY HISTORY: Family History  Problem Relation Age of Onset   Hypertension Mother    Depression Mother    Diabetes Father    Healthy Sister    Healthy Sister    Healthy Sister    Multiple sclerosis Maternal Grandmother    Diabetes Maternal Grandfather    Thyroid disease Maternal Grandfather    Breast cancer Paternal Grandmother    Bladder Cancer Paternal Grandfather    Diabetes Paternal Grandfather    Leukemia Paternal Grandfather     SOCIAL HISTORY:  reports that she has never smoked. She has been exposed to tobacco smoke. She has never used smokeless tobacco. She reports that she does not drink alcohol and does not use drugs.  PERFORMANCE STATUS: The patient's performance status is 1 - Symptomatic but completely ambulatory  PHYSICAL EXAM: Most Recent Vital Signs: Blood pressure 111/73, pulse 68, temperature 98.7 F (37.1 C), temperature source Oral, resp. rate 17, height 5\' 3"  (1.6 m), weight 228 lb 6.4 oz (103.6 kg), last menstrual period 06/13/2022, SpO2 100 %. BP 111/73 (BP Location: Right Arm, Patient Position: Sitting)   Pulse 68   Temp 98.7 F (37.1 C) (Oral)   Resp 17   Ht 5\' 3"  (1.6 m)   Wt 228 lb 6.4 oz (103.6 kg)   LMP 06/13/2022 (Approximate)   SpO2 100%   BMI 40.46 kg/m   General Appearance:    Alert, cooperative, no distress, appears stated age  Head:    Normocephalic, without obvious  abnormality, atraumatic  Eyes:    PERRL, conjunctiva/corneas clear, EOM's intact, fundi    benign, both eyes        Throat:   Lips, mucosa, and tongue normal; teeth and gums normal  Neck:   Supple, symmetrical, trachea midline, no adenopathy;    thyroid:  no enlargement/tenderness/nodules; no carotid   bruit or JVD  Back:     Symmetric, no curvature, ROM normal, no CVA tenderness  Lungs:     Clear to auscultation bilaterally, respirations unlabored  Chest Wall:    No tenderness or deformity   Heart:    Regular rate and rhythm, S1 and S2 normal, no murmur, rub   or gallop     Abdomen:  Soft, non-tender, bowel sounds active all four quadrants,    no masses, no organomegaly        Extremities:   Extremities normal, atraumatic, no cyanosis or edema  Pulses:   2+ and symmetric all extremities  Skin:   Skin color, texture, turgor normal, no rashes or lesions  Lymph nodes:   Cervical, supraclavicular, and axillary nodes normal  Neurologic:   CNII-XII intact, normal strength, sensation and reflexes    throughout    LABORATORY DATA:  Results for orders placed or performed in visit on 08/06/22 (from the past 48 hour(s))  CBC with Differential (Cancer Center Only)     Status: None   Collection Time: 08/06/22 10:40 AM  Result Value Ref Range   WBC Count 8.4 4.0 - 10.5 K/uL   RBC 5.11 3.87 - 5.11 MIL/uL   Hemoglobin 14.1 12.0 - 15.0 g/dL   HCT 16.1 09.6 - 04.5 %   MCV 82.2 80.0 - 100.0 fL   MCH 27.6 26.0 - 34.0 pg   MCHC 33.6 30.0 - 36.0 g/dL   RDW 40.9 81.1 - 91.4 %   Platelet Count 359 150 - 400 K/uL   nRBC 0.0 0.0 - 0.2 %   Neutrophils Relative % 55 %   Neutro Abs 4.7 1.7 - 7.7 K/uL   Lymphocytes Relative 37 %   Lymphs Abs 3.1 0.7 - 4.0 K/uL   Monocytes Relative 6 %   Monocytes Absolute 0.5 0.1 - 1.0 K/uL   Eosinophils Relative 1 %   Eosinophils Absolute 0.1 0.0 - 0.5 K/uL   Basophils Relative 1 %   Basophils Absolute 0.1 0.0 - 0.1 K/uL   Immature Granulocytes 0 %   Abs  Immature Granulocytes 0.03 0.00 - 0.07 K/uL    Comment: Performed at [facility], [address], [place], [place] [address]  Save Smear for Provider Slide Review     Status: None   Collection Time: 08/06/22 10:40 AM  Result Value Ref Range   Smear Review SMEAR STAINED AND AVAILABLE FOR REVIEW     Comment: Performed at [facility] at [facility], [address], [place], [place] [address]      RADIOGRAPHY: No results found.     PATHOLOGY: None  ASSESSMENT/PLAN: Ms. Grupp is a very pleasant 21 yo caucasian female with year long history of intermittent elevated WBC count.  CBC with diff and blood smear reviewed with Dr. Myna Hidalgo. Counts have returned to normal and blood smear showed no abnormality or evidence of malignancy.  As mentioned above she has a lot of issues concerning for possible PoTS as well as an underlying autoimmune disorder.  I reached out to her PCP and she will start the autoimmune work up and the patient will discuss possible PoTS diagnosis with cardiology at her appointment in 2 weeks. No follow-up with our office needed at this time.    All questions were answered. The patient knows to call the clinic with any problems, questions or concerns. We can certainly see the patient again for any future heme/onc issues.   The patient was discussed with Dr. Myna Hidalgo and he is in agreement with the aforementioned.   Eileen Stanford, NP

## 2022-08-07 ENCOUNTER — Telehealth: Payer: Self-pay | Admitting: *Deleted

## 2022-08-07 ENCOUNTER — Other Ambulatory Visit: Payer: Self-pay | Admitting: Nurse Practitioner

## 2022-08-07 ENCOUNTER — Telehealth: Payer: Self-pay

## 2022-08-07 ENCOUNTER — Telehealth: Payer: Self-pay | Admitting: Nurse Practitioner

## 2022-08-07 DIAGNOSIS — F411 Generalized anxiety disorder: Secondary | ICD-10-CM

## 2022-08-07 NOTE — Telephone Encounter (Signed)
Spoke with susan pt's mom advised to her that message has been sent to provider waiting on her to respond

## 2022-08-07 NOTE — Telephone Encounter (Signed)
Please advise 

## 2022-08-07 NOTE — Telephone Encounter (Signed)
Per 08/06/22 los -  : No follow-up needed.    

## 2022-08-07 NOTE — Telephone Encounter (Signed)
Pt mom calling back about blood work orders?? Message was sent earlier and haven't heard back?????

## 2022-08-07 NOTE — Telephone Encounter (Signed)
Waiting on blood work to be ordered from [facility] discuss yesterday with hematologist Would like to come in today to have it done.  Please call mother Darl Pikes [phone] once order has been placed.

## 2022-08-08 ENCOUNTER — Encounter: Payer: Self-pay | Admitting: Nurse Practitioner

## 2022-08-08 ENCOUNTER — Ambulatory Visit (INDEPENDENT_AMBULATORY_CARE_PROVIDER_SITE_OTHER): Payer: Medicaid Other | Admitting: Nurse Practitioner

## 2022-08-08 DIAGNOSIS — R52 Pain, unspecified: Secondary | ICD-10-CM | POA: Diagnosis not present

## 2022-08-08 NOTE — Progress Notes (Signed)
scheduled

## 2022-08-08 NOTE — Progress Notes (Signed)
Virtual Visit via Telephone Note  I connected with Dawn Mcpherson @ on 08/08/22 at 1100amby telephone and verified that I am speaking with the correct person using two identifiers.  I spent 8 minutes on this telephone encounter  Location: Patient: home Provider: office   I discussed the limitations, risks, security and privacy concerns of performing an evaluation and management service by telephone and the availability of in person appointments. I also discussed with the patient that there may be a patient responsible charge related to this service. The patient expressed understanding and agreed to proceed.   History of Present Illness: Dawn Mcpherson with past medical history of PSVT, GAD, Hidradenitis Suppurativa presents with complaints of random generalized body aches, swelling in the face , random fever, red facial rash across her cheeks and nose ,fatigue. she was evaluated by hematologist recently for leucocytosis which was thought to be related to her HS.  Her grandmother has MS. patient stated that her HS rashes are now healed, has completed full course of antibiotics ordered.   Observations/Objective:   Assessment and Plan:  Generalized body aches Check ESR, ANA, CRP If labs come back positive we will refer to rheumatology  Follow Up Instructions:    I discussed the assessment and treatment plan with the patient. The patient was provided an opportunity to ask questions and all were answered. The patient agreed with the plan and demonstrated an understanding of the instructions.   The patient was advised to call back or seek an in-person evaluation if the symptoms worsen or if the condition fails to improve as anticipated.

## 2022-08-08 NOTE — Telephone Encounter (Signed)
Pt had visit today.

## 2022-08-08 NOTE — Assessment & Plan Note (Signed)
Check ESR, ANA, CRP If labs come back positive we will refer to rheumatology

## 2022-08-10 LAB — SEDIMENTATION RATE: Sed Rate: 5 mm/hr (ref 0–32)

## 2022-08-10 LAB — ANA: Anti Nuclear Antibody (ANA): NEGATIVE

## 2022-08-10 LAB — C-REACTIVE PROTEIN: CRP: 5 mg/L (ref 0–10)

## 2022-08-15 NOTE — Progress Notes (Signed)
All labs are normal, I do not suspect lupus or an autoimmune condition at this time

## 2022-08-21 ENCOUNTER — Other Ambulatory Visit: Payer: Self-pay | Admitting: Internal Medicine

## 2022-08-22 NOTE — Telephone Encounter (Signed)
Adderall refilled

## 2022-08-22 NOTE — Telephone Encounter (Signed)
Please advise on med refill.   Allergies  Allergen Reactions   Ketorolac Tromethamine Palpitations   Gluten Meal    Reglan [Metoclopramide] Palpitations     Current Outpatient Medications:    amphetamine-dextroamphetamine (ADDERALL) 10 MG tablet, 1 twice daily as directed, Disp: 60 tablet, Rfl: 0   doxycycline (VIBRA-TABS) 100 MG tablet, Take 1 tablet (100 mg total) by mouth 2 (two) times daily. (Patient not taking: Reported on 08/06/2022), Disp: 20 tablet, Rfl: 0   furosemide (LASIX) 20 MG tablet, Take 20 mg by mouth daily., Disp: , Rfl:    hydrOXYzine (VISTARIL) 25 MG capsule, TAKE 1 CAPSULE EVERY 8 HOURS AS NEEDED FOR ANXIETY, Disp: 60 capsule, Rfl: 1   metoprolol tartrate (LOPRESSOR) 25 MG tablet, TAKE 1/2 TABLET BY MOUTH TWICE DAILY, Disp: 90 tablet, Rfl: 3   omeprazole (PRILOSEC) 20 MG capsule, Take 1 capsule (20 mg total) by mouth daily., Disp: 90 capsule, Rfl: 3   ondansetron (ZOFRAN) 4 MG tablet, Take 1 tablet (4 mg total) by mouth every 8 (eight) hours as needed for nausea or vomiting., Disp: 15 tablet, Rfl: 1   sertraline (ZOLOFT) 50 MG tablet, Take 50 mg by mouth daily., Disp: , Rfl:

## 2022-09-03 ENCOUNTER — Ambulatory Visit: Payer: Medicaid Other
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
## 2022-09-08 ENCOUNTER — Ambulatory Visit: Payer: Medicaid Other | Admitting: Internal Medicine

## 2022-09-10 ENCOUNTER — Ambulatory Visit: Payer: Medicaid Other

## 2022-09-17 ENCOUNTER — Ambulatory Visit (INDEPENDENT_AMBULATORY_CARE_PROVIDER_SITE_OTHER): Payer: Medicaid Other

## 2022-09-17 ENCOUNTER — Other Ambulatory Visit: Payer: Self-pay | Admitting: Nurse Practitioner

## 2022-09-17 DIAGNOSIS — Z23 Encounter for immunization: Secondary | ICD-10-CM | POA: Diagnosis not present

## 2022-09-17 DIAGNOSIS — Z111 Encounter for screening for respiratory tuberculosis: Secondary | ICD-10-CM

## 2022-09-22 ENCOUNTER — Other Ambulatory Visit: Payer: Self-pay | Admitting: Nurse Practitioner

## 2022-09-22 DIAGNOSIS — F411 Generalized anxiety disorder: Secondary | ICD-10-CM

## 2022-09-23 LAB — QUANTIFERON-TB GOLD PLUS
QuantiFERON Mitogen Value: >10 IU/mL
QuantiFERON Nil Value: 0.06 IU/mL
QuantiFERON TB1 Ag Value: 0.05 IU/mL
QuantiFERON TB2 Ag Value: 0.04 IU/mL
QuantiFERON-TB Gold Plus: NEGATIVE

## 2022-09-23 NOTE — Progress Notes (Signed)
The patient's lab is good, no TB infection. Thanks

## 2022-10-02 ENCOUNTER — Other Ambulatory Visit (INDEPENDENT_AMBULATORY_CARE_PROVIDER_SITE_OTHER): Payer: Self-pay | Admitting: Nurse Practitioner

## 2022-10-02 ENCOUNTER — Other Ambulatory Visit (INDEPENDENT_AMBULATORY_CARE_PROVIDER_SITE_OTHER): Payer: Self-pay | Admitting: Gastroenterology

## 2022-10-07 ENCOUNTER — Other Ambulatory Visit: Payer: Self-pay | Admitting: Internal Medicine

## 2022-10-07 MED ORDER — AMPHETAMINE-DEXTROAMPHETAMINE 10 MG PO TABS: ORAL_TABLET | ORAL | 0 refills | Status: DC

## 2022-10-07 NOTE — Telephone Encounter (Signed)
Adderall refilled

## 2022-10-07 NOTE — Telephone Encounter (Signed)
Dr. Annamaria Boots, please advise on med refill.    Current Outpatient Medications:    amphetamine-dextroamphetamine (ADDERALL) 10 MG tablet, TAKE ONE TABLET BY MOUTH TWICE A DAY AS DIRECTED, Disp: 60 tablet, Rfl: 0   doxycycline (VIBRA-TABS) 100 MG tablet, Take 1 tablet (100 mg total) by mouth 2 (two) times daily. (Patient not taking: Reported on 08/06/2022), Disp: 20 tablet, Rfl: 0   furosemide (LASIX) 20 MG tablet, Take 20 mg by mouth daily., Disp: , Rfl:    hydrOXYzine (VISTARIL) 25 MG capsule, TAKE 1 CAPSULE EVERY 8 HOURS AS NEEDED FOR ANXIETY, Disp: 60 capsule, Rfl: 1   metoprolol tartrate (LOPRESSOR) 25 MG tablet, TAKE 1/2 TABLET BY MOUTH TWICE DAILY, Disp: 90 tablet, Rfl: 3   omeprazole (PRILOSEC) 20 MG capsule, Take 1 capsule (20 mg total) by mouth daily., Disp: 90 capsule, Rfl: 3   ondansetron (ZOFRAN) 4 MG tablet, Take 1 tablet (4 mg total) by mouth every 8 (eight) hours as needed for nausea or vomiting., Disp: 15 tablet, Rfl: 1   sertraline (ZOLOFT) 50 MG tablet, Take 50 mg by mouth daily., Disp: , Rfl:     Allergies  Allergen Reactions   Ketorolac Tromethamine Palpitations   Gluten Meal    Reglan [Metoclopramide] Palpitations

## 2022-11-09 ENCOUNTER — Other Ambulatory Visit: Payer: Self-pay | Admitting: Internal Medicine

## 2022-11-10 MED ORDER — AMPHETAMINE-DEXTROAMPHETAMINE 10 MG PO TABS: ORAL_TABLET | ORAL | 0 refills | Status: DC

## 2022-11-10 NOTE — Telephone Encounter (Signed)
Dr.Young, please advise on refill request.  Allergies  Allergen Reactions   Ketorolac Tromethamine Palpitations   Gluten Meal    Reglan [Metoclopramide] Palpitations     Current Outpatient Medications:    amphetamine-dextroamphetamine (ADDERALL) 10 MG tablet, TAKE ONE TABLET BY MOUTH TWICE A DAY AS DIRECTED, Disp: 60 tablet, Rfl: 0   doxycycline (VIBRA-TABS) 100 MG tablet, Take 1 tablet (100 mg total) by mouth 2 (two) times daily. (Patient not taking: Reported on 08/06/2022), Disp: 20 tablet, Rfl: 0   furosemide (LASIX) 20 MG tablet, Take 20 mg by mouth daily., Disp: , Rfl:    hydrOXYzine (VISTARIL) 25 MG capsule, TAKE 1 CAPSULE EVERY 8 HOURS AS NEEDED FOR ANXIETY, Disp: 60 capsule, Rfl: 1   metoprolol tartrate (LOPRESSOR) 25 MG tablet, TAKE 1/2 TABLET BY MOUTH TWICE DAILY, Disp: 90 tablet, Rfl: 3   omeprazole (PRILOSEC) 20 MG capsule, Take 1 capsule (20 mg total) by mouth daily., Disp: 90 capsule, Rfl: 3   ondansetron (ZOFRAN) 4 MG tablet, Take 1 tablet (4 mg total) by mouth every 8 (eight) hours as needed for nausea or vomiting., Disp: 15 tablet, Rfl: 1   sertraline (ZOLOFT) 50 MG tablet, Take 50 mg by mouth daily., Disp: , Rfl:

## 2022-11-10 NOTE — Telephone Encounter (Signed)
Adderall refilled

## 2022-12-07 ENCOUNTER — Other Ambulatory Visit: Payer: Self-pay | Admitting: Internal Medicine

## 2022-12-07 DIAGNOSIS — F411 Generalized anxiety disorder: Secondary | ICD-10-CM

## 2022-12-15 IMAGING — DX DG CHEST 2V
2 series · 2 of 2 positions shown · non-contrast
Comparison: 11/04/2021

CLINICAL DATA: Shortness of breath

EXAM:
CHEST - 2 VIEW

[chest pa]
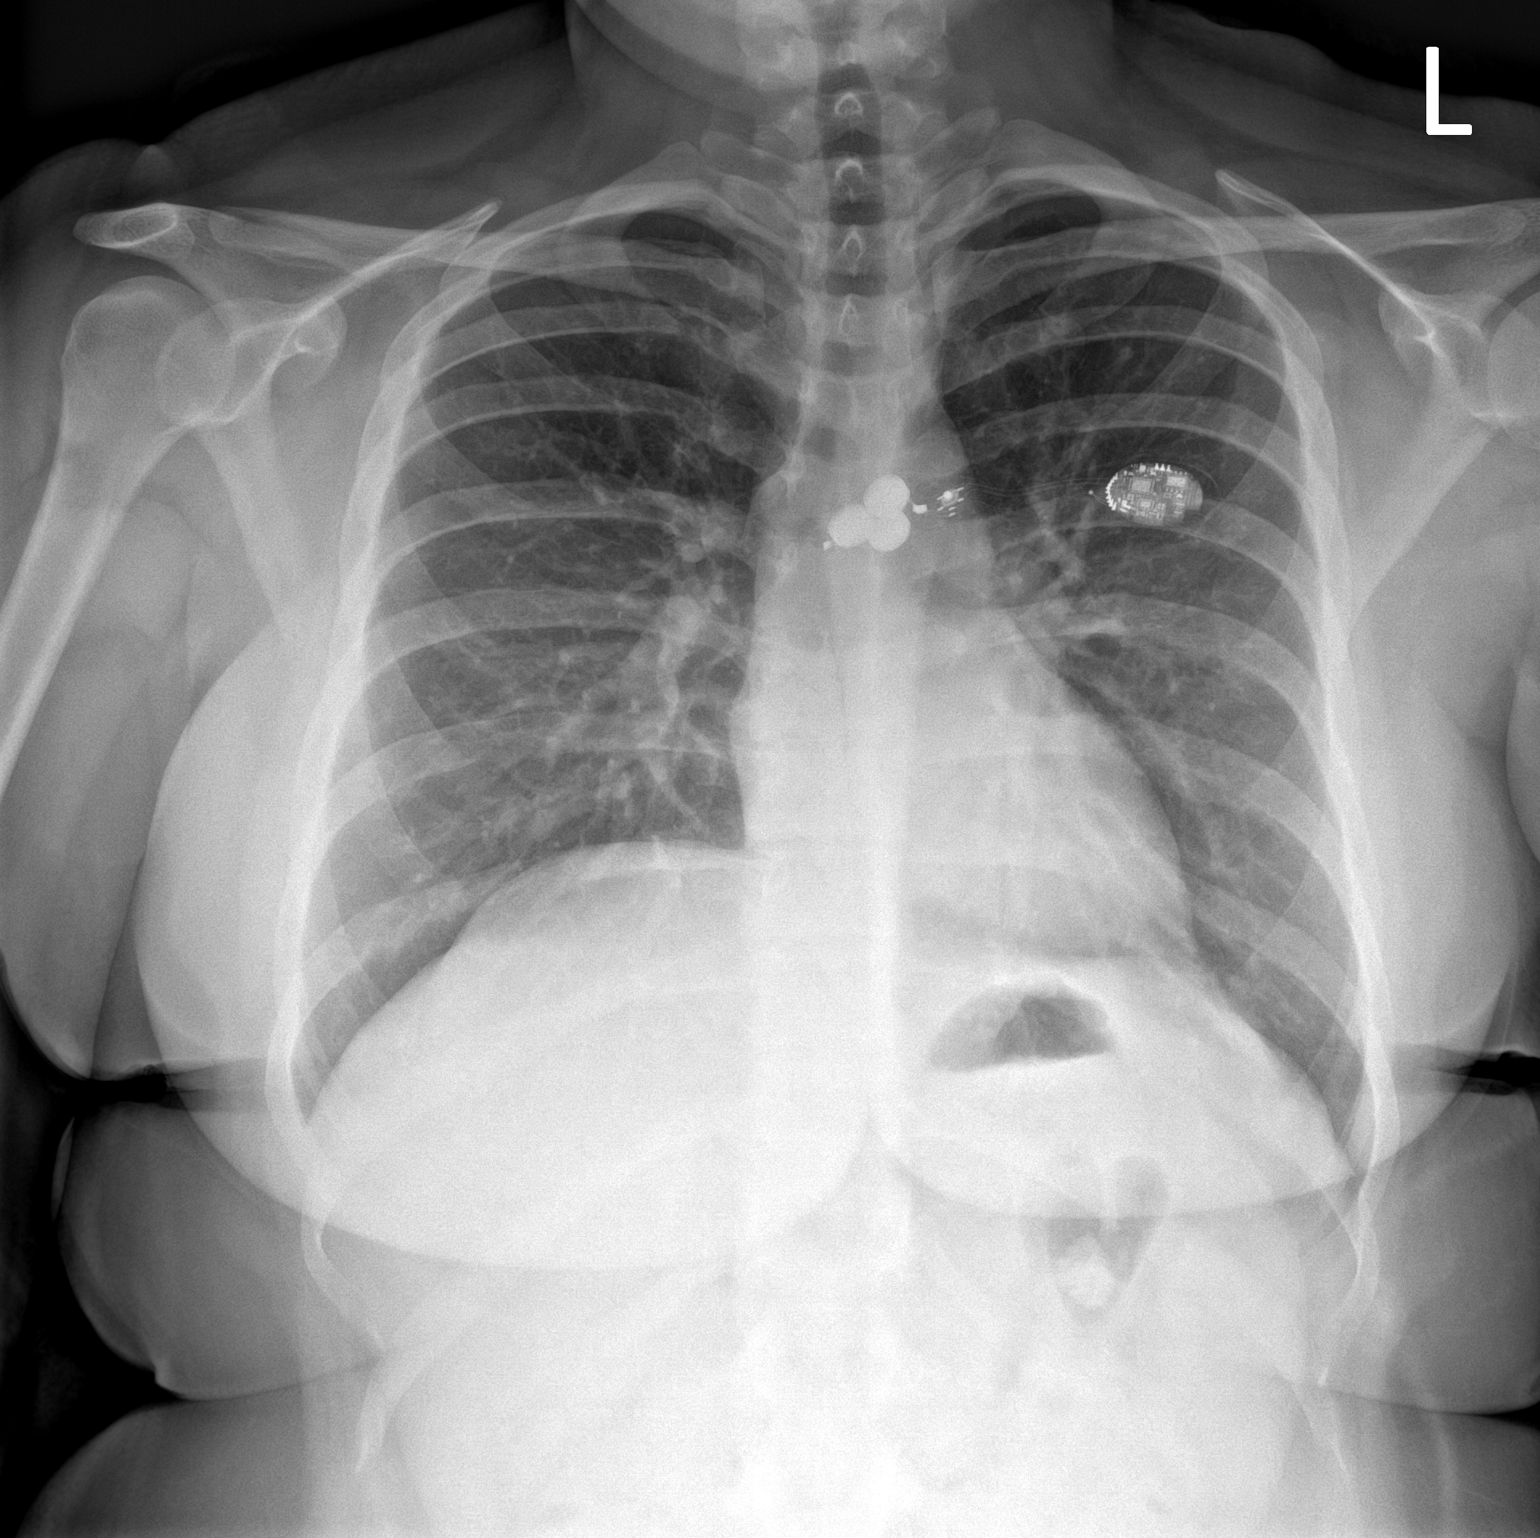

[chest lat]
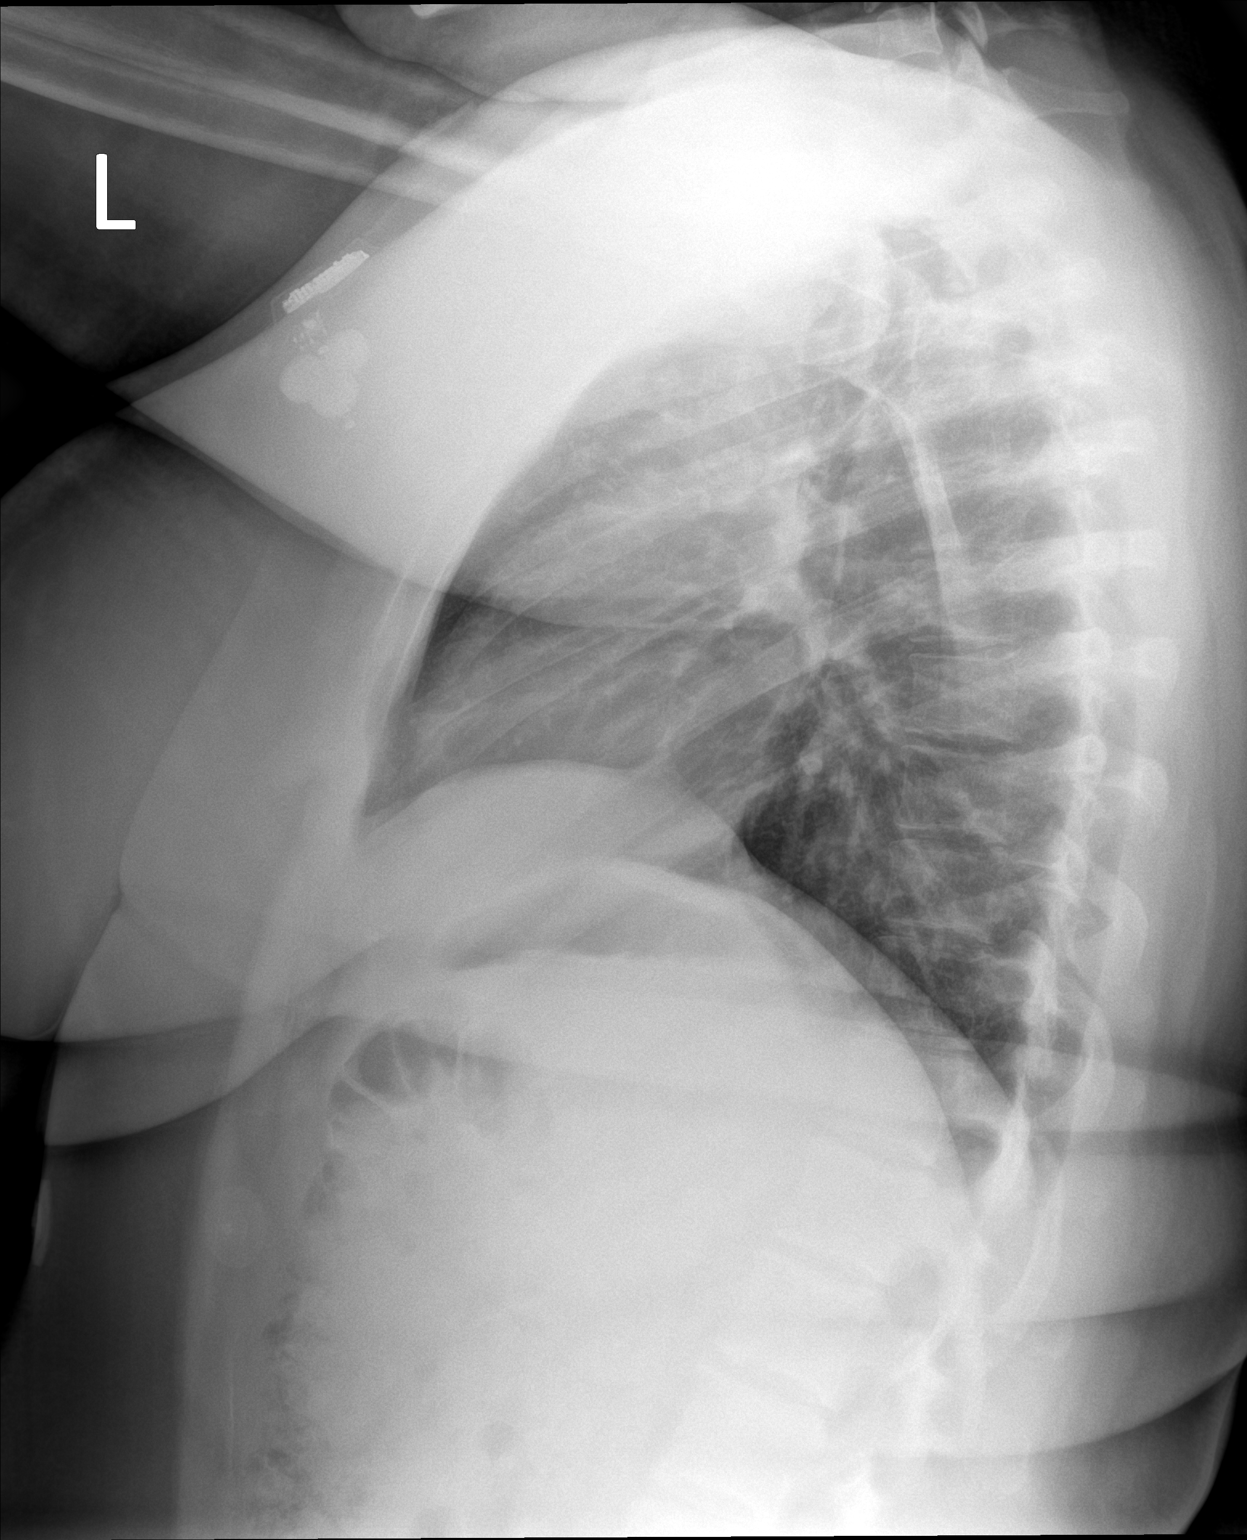

[2 of 2 positions shown; findings below may reference images not displayed]

FINDINGS: Cardiac and mediastinal contours are within normal limits. No focal
pulmonary opacity. No pleural effusion or pneumothorax. No acute
osseous abnormality.
IMPRESSION: No acute cardiopulmonary process.

## 2022-12-23 ENCOUNTER — Other Ambulatory Visit: Payer: Self-pay | Admitting: Internal Medicine

## 2022-12-23 NOTE — Telephone Encounter (Signed)
Please advise if this refill is appropriate.  Thank you.

## 2022-12-24 ENCOUNTER — Telehealth: Payer: Self-pay | Admitting: Internal Medicine

## 2022-12-24 ENCOUNTER — Ambulatory Visit: Payer: Medicaid Other | Admitting: Internal Medicine

## 2022-12-24 ENCOUNTER — Telehealth: Payer: Medicaid Other | Admitting: Nurse Practitioner

## 2022-12-24 DIAGNOSIS — J069 Acute upper respiratory infection, unspecified: Secondary | ICD-10-CM | POA: Diagnosis not present

## 2022-12-24 MED ORDER — AMPHETAMINE-DEXTROAMPHETAMINE 10 MG PO TABS: ORAL_TABLET | ORAL | 0 refills | Status: DC

## 2022-12-24 NOTE — Progress Notes (Signed)
E-Visit for Sore Throat  We are sorry that you are not feeling well.  Here is how we plan to help!  Your symptoms indicate a likely viral infection (Pharyngitis).   Pharyngitis is inflammation in the back of the throat which can cause a sore throat, scratchiness and sometimes difficulty swallowing.   Pharyngitis is typically caused by a respiratory virus and will just run its course.    Sore throats have been one of the most reported symptoms with recent episodes of COVID. You may want to consider taking a home test if able.    Please keep in mind that your symptoms could last up to 10 days.  For throat pain, we recommend over the counter oral pain relief medications such as acetaminophen or aspirin, or anti-inflammatory medications such as ibuprofen or naproxen sodium.  Topical treatments such as oral throat lozenges or sprays may be used as needed.  Avoid close contact with loved ones, especially the very young and elderly.  Remember to wash your hands thoroughly throughout the day as this is the number one way to prevent the spread of infection and wipe down door knobs and counters with disinfectant.  After careful review of your answers, I would not recommend an antibiotic for your condition.  Antibiotics should not be used to treat conditions that we suspect are caused by viruses like the virus that causes the common cold or flu. However, some people can have Strep with atypical symptoms. You may need formal testing in clinic or office to confirm if your symptoms continue or worsen.  Providers prescribe antibiotics to treat infections caused by bacteria. Antibiotics are very powerful in treating bacterial infections when they are used properly.  To maintain their effectiveness, they should be used only when necessary.  Overuse of antibiotics has resulted in the development of super bugs that are resistant to treatment!    Home Care: Only take medications as instructed by your medical team. Do  not drink alcohol while taking these medications. A steam or ultrasonic humidifier can help congestion.  You can place a towel over your head and breathe in the steam from hot water coming from a faucet. Avoid close contacts especially the very young and the elderly. Cover your mouth when you cough or sneeze. Always remember to wash your hands.  Get Help Right Away If: You develop worsening fever or throat pain. You develop a severe head ache or visual changes. Your symptoms persist after you have completed your treatment plan.  Make sure you Understand these instructions. Will watch your condition. Will get help right away if you are not doing well or get worse.   Thank you for choosing an e-visit.  Your e-visit answers were reviewed by a board certified advanced clinical practitioner to complete your personal care plan. Depending upon the condition, your plan could have included both over the counter or prescription medications.  Please review your pharmacy choice. Make sure the pharmacy is open so you can pick up prescription now. If there is a problem, you may contact your provider through [employer] and have the prescription routed to another pharmacy.  Your safety is important to [place]. If you have drug allergies check your prescription carefully.   For the next 24 hours you can use MyChart to ask questions about today's visit, request a non-urgent call back, or ask for a work or school excuse. You will get an email in the next two days asking about your experience. I hope that  your e-visit has been valuable and will speed your recovery.

## 2022-12-24 NOTE — Telephone Encounter (Signed)
Previous [place] pt  She has missed 3 appts as of today. How would you like to proceed?

## 2022-12-24 NOTE — Telephone Encounter (Signed)
Adderall refilled

## 2022-12-25 ENCOUNTER — Encounter: Payer: Self-pay | Admitting: Internal Medicine

## 2023-01-27 ENCOUNTER — Encounter: Payer: Self-pay | Admitting: Internal Medicine

## 2023-01-27 ENCOUNTER — Ambulatory Visit: Payer: Medicaid Other | Admitting: Internal Medicine

## 2023-01-27 ENCOUNTER — Ambulatory Visit: Payer: Medicaid Other | Admitting: Nurse Practitioner

## 2023-02-01 ENCOUNTER — Other Ambulatory Visit: Payer: Self-pay | Admitting: Internal Medicine

## 2023-02-02 MED ORDER — AMPHETAMINE-DEXTROAMPHETAMINE 10 MG PO TABS: ORAL_TABLET | ORAL | 0 refills | Status: DC

## 2023-02-02 NOTE — Telephone Encounter (Signed)
Adderall 10 mg refilled

## 2023-03-07 ENCOUNTER — Other Ambulatory Visit: Payer: Self-pay | Admitting: Internal Medicine

## 2023-03-10 MED ORDER — AMPHETAMINE-DEXTROAMPHETAMINE 10 MG PO TABS: ORAL_TABLET | ORAL | 0 refills | Status: DC

## 2023-03-10 NOTE — Telephone Encounter (Signed)
Adderall 10 mg refilled 

## 2023-03-10 NOTE — Telephone Encounter (Signed)
Please advise on refill request   Allergies  Allergen Reactions   Ketorolac Tromethamine Palpitations   Gluten Meal    Reglan [Metoclopramide] Palpitations     Current Outpatient Medications:    amphetamine-dextroamphetamine (ADDERALL) 10 MG tablet, TAKE ONE TABLET BY MOUTH TWICE A DAY AS DIRECTED, Disp: 60 tablet, Rfl: 0   doxycycline (VIBRA-TABS) 100 MG tablet, Take 1 tablet (100 mg total) by mouth 2 (two) times daily. (Patient not taking: Reported on 08/06/2022), Disp: 20 tablet, Rfl: 0   furosemide (LASIX) 20 MG tablet, Take 20 mg by mouth daily., Disp: , Rfl:    hydrOXYzine (VISTARIL) 25 MG capsule, TAKE 1 CAPSULE EVERY 8 HOURS AS NEEDED FOR ANXIETY, Disp: 60 capsule, Rfl: 1   metoprolol tartrate (LOPRESSOR) 25 MG tablet, TAKE 1/2 TABLET BY MOUTH TWICE DAILY, Disp: 90 tablet, Rfl: 3   omeprazole (PRILOSEC) 20 MG capsule, Take 1 capsule (20 mg total) by mouth daily., Disp: 90 capsule, Rfl: 3   ondansetron (ZOFRAN) 4 MG tablet, Take 1 tablet (4 mg total) by mouth every 8 (eight) hours as needed for nausea or vomiting., Disp: 15 tablet, Rfl: 1   sertraline (ZOLOFT) 50 MG tablet, Take 50 mg by mouth daily., Disp: , Rfl:

## 2023-03-15 ENCOUNTER — Other Ambulatory Visit: Payer: Self-pay | Admitting: Internal Medicine

## 2023-03-15 DIAGNOSIS — F411 Generalized anxiety disorder: Secondary | ICD-10-CM

## 2023-03-19 ENCOUNTER — Other Ambulatory Visit: Payer: Self-pay

## 2023-03-19 ENCOUNTER — Other Ambulatory Visit: Payer: Self-pay | Admitting: Internal Medicine

## 2023-03-19 ENCOUNTER — Telehealth: Payer: Self-pay | Admitting: Internal Medicine

## 2023-03-19 DIAGNOSIS — F411 Generalized anxiety disorder: Secondary | ICD-10-CM

## 2023-03-19 MED ORDER — HYDROXYZINE PAMOATE 25 MG PO CAPS: ORAL_CAPSULE | ORAL | 0 refills | Status: AC

## 2023-03-19 NOTE — Telephone Encounter (Signed)
Per Dr Doren Custard we can send in a week supply to get her until her appointment next week.

## 2023-03-19 NOTE — Telephone Encounter (Signed)
Patient has a new patient appt on April 5 with new provider. Can get refill until then.  drOXYzine (VISTARIL) 25 MG capsule [id number]   Pharmacy  [facility], [place] - [address], [place] [address] Phone: [phone]  Fax: [phone] DEA #: --

## 2023-03-24 ENCOUNTER — Telehealth: Payer: Self-pay | Admitting: Internal Medicine

## 2023-03-24 NOTE — Telephone Encounter (Signed)
ok 

## 2023-03-24 NOTE — Telephone Encounter (Signed)
Good afternoon Dr. Hilarie Fredrickson,    We received a call from this patient's mother requesting to schedule an appointment with you. She states you are highly recommended. Patient does have GI history with [facility] in 2022 and states they are not satisfied with the care received there. Records are in Epic for your review. Please review and advise on scheduling.   Thank you.

## 2023-04-10 ENCOUNTER — Other Ambulatory Visit: Payer: Self-pay | Admitting: Internal Medicine

## 2023-04-13 MED ORDER — AMPHETAMINE-DEXTROAMPHETAMINE 10 MG PO TABS: ORAL_TABLET | ORAL | 0 refills | Status: DC

## 2023-04-13 NOTE — Telephone Encounter (Signed)
Adderall refilled Please make her an appointment sometime this summer

## 2023-04-13 NOTE — Telephone Encounter (Signed)
Left voicemail for patient to call back and schedule OV with Dr. Rhea Belton.

## 2023-04-13 NOTE — Telephone Encounter (Signed)
Received a medication refill request from patient. She is requesting a refill on her Adderall  prescription. Last RX was sent on 03/10/23 for 60 tablets. Last OV was on 05/22/22. She had a follow up scheduled for 09/08/22 but she was a no show.   Dr. Maple Hudson, please advise if you are ok with this refill. Thanks!

## 2023-04-21 ENCOUNTER — Other Ambulatory Visit (HOSPITAL_COMMUNITY): Payer: Self-pay

## 2023-04-30 NOTE — Telephone Encounter (Signed)
ATC X1 LVM for patient to call the office back. Please schedule f/u with Dr. Maple Hudson this summer

## 2023-05-20 ENCOUNTER — Other Ambulatory Visit: Payer: Self-pay | Admitting: Internal Medicine

## 2023-05-20 MED ORDER — AMPHETAMINE-DEXTROAMPHETAMINE 10 MG PO TABS: ORAL_TABLET | ORAL | 0 refills | Status: DC

## 2023-05-20 NOTE — Telephone Encounter (Signed)
Adderall refilled

## 2023-06-21 ENCOUNTER — Other Ambulatory Visit: Payer: Self-pay | Admitting: Internal Medicine

## 2023-06-24 ENCOUNTER — Other Ambulatory Visit: Payer: Self-pay | Admitting: Internal Medicine

## 2023-06-26 NOTE — Telephone Encounter (Signed)
Patient needs a OV for future refill per CY

## 2023-06-26 NOTE — Telephone Encounter (Signed)
Patient needs OV for Adderall refill per CY. My chart message has been sent also called pt to try and schedule

## 2023-07-01 ENCOUNTER — Telehealth: Payer: Self-pay | Admitting: Internal Medicine

## 2023-07-02 MED ORDER — AMPHETAMINE-DEXTROAMPHETAMINE 10 MG PO TABS: ORAL_TABLET | ORAL | 0 refills | Status: AC

## 2023-07-02 NOTE — Telephone Encounter (Signed)
Adderall refilled

## 2023-07-02 NOTE — Telephone Encounter (Signed)
Pt calling in bc she is saying she will get a courtesy refill of Adderall

## 2023-07-03 ENCOUNTER — Other Ambulatory Visit (HOSPITAL_COMMUNITY): Payer: Self-pay

## 2023-07-21 MED ORDER — AMPHETAMINE-DEXTROAMPHETAMINE 10 MG PO TABS: ORAL_TABLET | ORAL | 0 refills | Status: DC

## 2023-07-21 NOTE — Telephone Encounter (Signed)
Adderall refilled

## 2023-08-03 ENCOUNTER — Other Ambulatory Visit (HOSPITAL_COMMUNITY): Payer: Self-pay

## 2023-08-03 ENCOUNTER — Telehealth: Payer: Self-pay

## 2023-08-03 NOTE — Telephone Encounter (Signed)
PA request for Adderall, patient has not been seen in 1+ year.  PA cancelled.
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
## 2023-08-07 ENCOUNTER — Ambulatory Visit: Payer: PRIVATE HEALTH INSURANCE | Admitting: Internal Medicine

## 2023-09-07 ENCOUNTER — Other Ambulatory Visit: Payer: Self-pay | Admitting: Internal Medicine

## 2023-09-08 ENCOUNTER — Other Ambulatory Visit: Payer: Self-pay | Admitting: Internal Medicine

## 2023-09-16 ENCOUNTER — Telehealth: Payer: Self-pay | Admitting: Internal Medicine

## 2023-09-16 NOTE — Telephone Encounter (Signed)
Patient needs a refill of adderral. She is currently out. Next available appointment has been made with NP Cobb for November 11th.

## 2023-09-17 ENCOUNTER — Other Ambulatory Visit: Payer: Self-pay | Admitting: Internal Medicine

## 2023-09-17 ENCOUNTER — Encounter: Payer: Self-pay | Admitting: *Deleted

## 2023-09-17 NOTE — Telephone Encounter (Signed)
I had already sent message before. I cannot refill a controlled drug until she is seen here in the office. It has been more than a year since last seen.

## 2023-09-17 NOTE — Telephone Encounter (Signed)
Dr. Maple Hudson pt requesting refill. Pt has a f/u visit 11/01/24

## 2023-09-17 NOTE — Telephone Encounter (Signed)
Yes sir, it will continue to be in the refill bin until it is denied by the provider.

## 2023-09-17 NOTE — Telephone Encounter (Signed)
Msg sent to pt to verify pharm  Will then sent to [facility]

## 2023-09-18 MED ORDER — AMPHETAMINE-DEXTROAMPHETAMINE 10 MG PO TABS: ORAL_TABLET | ORAL | 0 refills | Status: DC

## 2023-09-18 NOTE — Telephone Encounter (Signed)
Patient notified

## 2023-09-18 NOTE — Telephone Encounter (Signed)
Adderall refilled this time. No further refills until seen in office. For controlled drug, must be seen yearly.

## 2023-09-18 NOTE — Telephone Encounter (Signed)
Dawn Mcpherson mother checking on message for medication. Patient out of medication. Dawn Mcpherson phone number is [phone].

## 2023-09-18 NOTE — Telephone Encounter (Signed)
Patient used same pharmacy [facility] in [place] refill on Adderall 10  mg.

## 2023-09-18 NOTE — Telephone Encounter (Signed)
This patient message was added to a patient message from today(9/27). See patient message from 9/27.  Nothing further needed.

## 2023-09-18 NOTE — Telephone Encounter (Signed)
Dawn Mcpherson can you send in this patient's medication. It looks like Dr. Maple Hudson is gone for the day. She has sent multiple messages and called trying to get a refill sent in.  Patient message from 09/17/2023- I spoke to someone in the office yesterday who let me know that they would send in courtesy refill yesterday since I've been scheduled for an appointment but it's not until November. I really need that medication though and my drug store hasn't received it yet.. I just want to make sure that is going to be sent over? Thank you

## 2023-09-18 NOTE — Telephone Encounter (Signed)
Dr. Maple Hudson sent it today from what I can see in her chart

## 2023-10-18 ENCOUNTER — Other Ambulatory Visit: Payer: Self-pay | Admitting: Internal Medicine

## 2023-10-19 ENCOUNTER — Other Ambulatory Visit (HOSPITAL_COMMUNITY): Payer: Self-pay

## 2023-10-19 MED ORDER — AMPHETAMINE-DEXTROAMPHETAMINE 10 MG PO TABS: ORAL_TABLET | ORAL | 0 refills | Status: DC

## 2023-10-19 NOTE — Telephone Encounter (Signed)
Adderall refilled

## 2023-10-23 ENCOUNTER — Other Ambulatory Visit (HOSPITAL_COMMUNITY): Payer: Self-pay

## 2023-11-02 ENCOUNTER — Encounter: Payer: Self-pay | Admitting: Nurse Practitioner

## 2023-11-02 ENCOUNTER — Ambulatory Visit: Payer: BC Managed Care – PPO | Admitting: Nurse Practitioner

## 2023-11-02 VITALS — BP 113/77 | HR 86 | Ht 63.0 in | Wt 209.4 lb

## 2023-11-02 DIAGNOSIS — Z0000 Encounter for general adult medical examination without abnormal findings: Secondary | ICD-10-CM | POA: Insufficient documentation

## 2023-11-02 DIAGNOSIS — G90A Postural orthostatic tachycardia syndrome (POTS): Secondary | ICD-10-CM | POA: Diagnosis not present

## 2023-11-02 DIAGNOSIS — Z23 Encounter for immunization: Secondary | ICD-10-CM | POA: Diagnosis not present

## 2023-11-02 DIAGNOSIS — G47411 Narcolepsy with cataplexy: Secondary | ICD-10-CM

## 2023-11-02 NOTE — Progress Notes (Signed)
@Patient  ID: Dawn Mcpherson, female    DOB: Jan 08, 2001, 22 y.o.   MRN: [id number]  Chief Complaint  Patient presents with   Follow-up    coPt denies any concerns. Here for 1 year f/u     Referring provider: No ref. 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  She is a patient of Dr. Roxy Cedar and last seen in office 05/22/2022.  Past medical history significant for PSVT, ADHD, anxiety, depression.  TEST/EVENTS:  07/22/2022 PSG: Negative for sleep disordered breathing 07/23/2022 MSLT: 5/5 naps; 1/5 SOREMs. Short mean sleep latency (8 min or less). >>  Suggest treating as probable narcolepsy syndrome  05/22/2022: OV with Dr. Maple Hudson for sleep consult.  Concern for possible underlying OSA with history of SVT or palpitations.  Recommended by cardiology for to have further evaluation.  Has a lot of trouble with sleep and staying asleep.  Sleep is restless and moves a lot.  She does have a history of ADHD.  Mother has narcolepsy.  Onset of excessive daytime sleepiness noted perhaps as far back as middle school.  Difficulty with focus and concentration diagnosed ADHD.  Responded well to Adderall in the past.  At that time she is being followed by psychiatrist who took her off Adderall.  States that she feels tired all the time.  Sleep is restless.  Wakes frequently.  Minimizes caffeine because of tachypalpitations.  Frequent sleep paralysis.  Frequent cataplexy-episodic weakness if startled or angry.  Aware she snores.  Witnessed apnea not described.  Schedule NPSG/MSLT to confirm document narcolepsy.  If she has significant OSA, will need to treat that first.  Temporary Adderall.  Advised to stop for sleep study.  11/02/2023: Today - follow up Patient presents today for follow-up.  After her last visit, she had MSLT which was nondiagnostic for sleep apnea as she only had 1 out of 5 SOREMs; however, she had very short sleep latency and took all 5 naps.   She was treated as presumed narcoleptic.  She has been doing well with the Adderall since she started this.  She is taking 10 mg twice a day.  She feels like it helps with her daytime sleepiness.  She does sometimes take 1 nap a day but she is not falling asleep without intending to or dozing off during conversations.  She feels like she can get through her day much more easily than she used to be able to.  She does also feel like her focus is better.  She is currently in school for surgical tech and doing well.  Plans to graduate in May.  Nighttime sleep is more restful. No issues with mood changes, high blood pressure or aberrant behavior. She was diagnosed with POTS since she was here last.  She is being managed by her cardiologist.  They were fine with her continuing Adderall as prescribed. No recent episodes of cataplexy  Allergies  Allergen Reactions   Ketorolac Tromethamine Palpitations   Gluten Meal    Reglan [Metoclopramide] Palpitations    Immunization History  Administered Date(s) Administered   DTaP / IPV 05/22/2008   Dtap, Unspecified 12/31/2001, 03/29/2002, 09/02/2002, 10/13/2003   HIB, Unspecified 12/31/2001, 03/29/2002, 09/02/2002, 01/10/2003   HPV 9-valent 12/24/2017, 05/30/2020, 04/27/2023   Hep B, Unspecified Apr 24, 2001, 10/26/2001, 09/02/2002   Hepatitis A, Ped/Adol-2 Dose 05/22/2008, 09/15/2012   Hepatitis B, ADULT 09/05/2022   Hpv-Unspecified 12/24/2017, 05/30/2020   Influenza Nasal 09/19/2008   Influenza, Seasonal, Injecte, Preservative Fre  11/02/2023   Influenza,Quad,Nasal, Live 09/15/2012   Influenza,inj,Quad PF,6+ Mos 09/17/2022   MMR 01/10/2003, 05/22/2008   Meningococcal Conjugate 08/22/2014, 05/30/2020   PPD Test 07/29/2022   Pneumococcal Conjugate PCV 7 12/31/2001, 03/29/2002, 09/02/2002, 10/13/2003   Polio, Unspecified 12/31/2001, 03/29/2002, 01/10/2003   Tdap 08/26/2012, 09/15/2012, 09/05/2022   Varicella 10/13/2003, 05/22/2008    Past Medical History:   Diagnosis Date   Anxiety    Depression    Depression    Phreesia 03/06/2021   Depression    Phreesia 04/30/2021   Headache(784.0)    IBS (irritable bowel syndrome)     Tobacco History: Social History   Tobacco Use  Smoking Status Never   Passive exposure: Yes  Smokeless Tobacco Never   Counseling given: Not Answered   Outpatient Medications Prior to Visit  Medication Sig Dispense Refill   amphetamine-dextroamphetamine (ADDERALL) 10 MG tablet TAKE ONE TABLET BY MOUTH TWICE A DAY AS DIRECTED 60 tablet 0   amphetamine-dextroamphetamine (ADDERALL) 10 MG tablet TAKE ONE TABLET BY MOUTH TWICE A DAY AS DIRECTED 60 tablet 0   amphetamine-dextroamphetamine (ADDERALL) 10 MG tablet TAKE ONE TABLET BY MOUTH TWICE A DAY AS DIRECTED 60 tablet 0   hydrOXYzine (VISTARIL) 25 MG capsule TAKE 1 CAPSULE EVERY 8 HOURS AS NEEDED FOR ANXIETY 14 capsule 0   metoprolol tartrate (LOPRESSOR) 25 MG tablet TAKE 1/2 TABLET BY MOUTH TWICE DAILY 90 tablet 3   omeprazole (PRILOSEC) 20 MG capsule Take 1 capsule (20 mg total) by mouth daily. 90 capsule 3   ondansetron (ZOFRAN) 4 MG tablet Take 1 tablet (4 mg total) by mouth every 8 (eight) hours as needed for nausea or vomiting. 15 tablet 1   sertraline (ZOLOFT) 50 MG tablet Take 50 mg by mouth daily.     furosemide (LASIX) 20 MG tablet Take 20 mg by mouth daily. (Patient not taking: Reported on 11/02/2023)     No facility-administered medications prior to visit.     Review of Systems:   Constitutional: No weight loss or gain, night sweats, fevers, chills, or lassitude. +daytime sleepiness (improved) HEENT: No headaches, difficulty swallowing, tooth/dental problems, or sore throat. No sneezing, itching, ear ache, nasal congestion, or post nasal drip CV:  +occasional palpitations; dizziness (baseline). No chest pain, orthopnea, PND, swelling in lower extremities, anasarca, syncope Resp: No shortness of breath with exertion or at rest. No excess mucus or  change in color of mucus. No productive or non-productive. No hemoptysis. No wheezing.  No chest wall deformity GI:  No heartburn, indigestion Skin: No rash, lesions, ulcerations MSK:  No joint pain or swelling.   Neuro: No dizziness or lightheadedness.  Psych: No depression or anxiety. Mood stable.     Physical Exam:  BP 113/77   Pulse 86   Ht 5\' 3"  (1.6 m)   Wt 209 lb 6.4 oz (95 kg)   SpO2 96%   BMI 37.09 kg/m   GEN: Pleasant, interactive, well-appearing; in no acute distress HEENT:  Normocephalic and atraumatic. PERRLA. Sclera white. Nasal turbinates pink, moist and patent bilaterally. No rhinorrhea present. Oropharynx pink and moist, without exudate or edema. No lesions, ulcerations, or postnasal drip.  NECK:  Supple w/ fair ROM. No JVD present. Normal carotid impulses w/o bruits. Thyroid symmetrical with no goiter or nodules palpated. No lymphadenopathy.   CV: RRR, no m/r/g, no peripheral edema. Pulses intact, +2 bilaterally. No cyanosis, pallor or clubbing. PULMONARY:  Unlabored, regular breathing. Clear bilaterally A&P w/o wheezes/rales/rhonchi. No accessory muscle use.  GI: BS present  and normoactive. Soft, non-tender to palpation. No organomegaly or masses detected.  MSK: No erythema, warmth or tenderness. Cap refil <2 sec all extrem. No deformities or joint swelling noted.  Neuro: A/Ox3. No focal deficits noted.   Skin: Warm, no lesions or rashe Psych: Normal affect and behavior. Judgement and thought content appropriate.     Lab Results:  CBC    Component Value Date/Time   WBC 8.4 08/06/2022 1040   WBC 11.1 (H) 03/19/2022 1930   RBC 5.11 08/06/2022 1040   HGB 14.1 08/06/2022 1040   HGB 14.0 07/29/2022 1632   HCT 42.0 08/06/2022 1040   HCT 41.2 07/29/2022 1632   PLT 359 08/06/2022 1040   PLT 377 07/29/2022 1632   MCV 82.2 08/06/2022 1040   MCV 83 07/29/2022 1632   MCH 27.6 08/06/2022 1040   MCHC 33.6 08/06/2022 1040   RDW 12.6 08/06/2022 1040   RDW 13.1  07/29/2022 1632   LYMPHSABS 3.1 08/06/2022 1040   LYMPHSABS 3.8 (H) 07/29/2022 1632   MONOABS 0.5 08/06/2022 1040   EOSABS 0.1 08/06/2022 1040   EOSABS 0.1 07/29/2022 1632   BASOSABS 0.1 08/06/2022 1040   BASOSABS 0.1 07/29/2022 1632    BMET    Component Value Date/Time   NA 142 08/06/2022 1040   NA 139 04/30/2021 0959   K 4.1 08/06/2022 1040   CL 107 08/06/2022 1040   CO2 28 08/06/2022 1040   GLUCOSE 93 08/06/2022 1040   BUN 10 08/06/2022 1040   BUN 10 04/30/2021 0959   CREATININE 0.91 08/06/2022 1040   CALCIUM 9.7 08/06/2022 1040   GFRNONAA >60 08/06/2022 1040   GFRAA >60 08/29/2020 1209    BNP    Component Value Date/Time   BNP 23.4 03/19/2022 1924     Imaging:  No results found.  Administration History     None           No data to display          No results found for: "NITRICOXIDE"      Assessment & Plan:   Narcolepsy and cataplexy Presumed narcolepsy with history of cataplectic symptoms.  She has received benefit from Adderall therapy.  No aberrant behavior.  Tolerating well with no significant side effects.  We will continue her on this.  Sleep hygiene reviewed.  Advised to take naps as needed.  Monitor for daytime sleepiness and reviewed safe driving practices.  Patient Instructions  Continue Adderall 10 mg Twice daily   Continue to work on healthy sleep habits  Follow up in 6 months with Dr. Maple Hudson. If symptoms worsen, please contact office for sooner follow up or seek emergency care.    POTS (postural orthostatic tachycardia syndrome) Follow-up with cardiology as scheduled  Healthcare maintenance Flu shot today   Advised if symptoms do not improve or worsen, to please contact office for sooner follow up or seek emergency care.   I spent 25 minutes of dedicated to the care of this patient on the date of this encounter to include pre-visit review of records, face-to-face time with the patient discussing conditions above, post  visit ordering of testing, clinical documentation with the electronic health record, making appropriate referrals as documented, and communicating necessary findings to members of the patients care team.  Noemi Chapel, NP 11/02/2023  Pt aware and understands NP's role.

## 2023-11-02 NOTE — Assessment & Plan Note (Signed)
Presumed narcolepsy with history of cataplectic symptoms.  She has received benefit from Adderall therapy.  No aberrant behavior.  Tolerating well with no significant side effects.  We will continue her on this.  Sleep hygiene reviewed.  Advised to take naps as needed.  Monitor for daytime sleepiness and reviewed safe driving practices.  Patient Instructions  Continue Adderall 10 mg Twice daily   Continue to work on healthy sleep habits  Follow up in 6 months with Dr. Maple Hudson. If symptoms worsen, please contact office for sooner follow up or seek emergency care.

## 2023-11-02 NOTE — Assessment & Plan Note (Signed)
Flu shot today 

## 2023-11-02 NOTE — Assessment & Plan Note (Signed)
Follow up with cardiology as scheduled. °

## 2023-11-02 NOTE — Patient Instructions (Signed)
Continue Adderall 10 mg Twice daily   Continue to work on healthy sleep habits  Follow up in 6 months with Dr. Maple Hudson. If symptoms worsen, please contact office for sooner follow up or seek emergency care.

## 2023-11-15 ENCOUNTER — Other Ambulatory Visit: Payer: Self-pay | Admitting: Internal Medicine

## 2023-11-16 MED ORDER — AMPHETAMINE-DEXTROAMPHETAMINE 10 MG PO TABS: ORAL_TABLET | ORAL | 0 refills | Status: AC

## 2023-12-28 ENCOUNTER — Other Ambulatory Visit: Payer: Self-pay | Admitting: Internal Medicine

## 2023-12-31 MED ORDER — AMPHETAMINE-DEXTROAMPHETAMINE 10 MG PO TABS: ORAL_TABLET | ORAL | 0 refills | Status: DC

## 2023-12-31 NOTE — Telephone Encounter (Signed)
 Adderall refilled

## 2023-12-31 NOTE — Telephone Encounter (Signed)
Pt requesting refill for Adderall. Please advise.

## 2024-01-29 ENCOUNTER — Other Ambulatory Visit: Payer: Self-pay

## 2024-01-29 ENCOUNTER — Emergency Department (HOSPITAL_BASED_OUTPATIENT_CLINIC_OR_DEPARTMENT_OTHER)
Admission: EM | Admit: 2024-01-29 | Discharge: 2024-01-30 | Disposition: A | Discharge status: Home / Self Care | Payer: BLUE CROSS/BLUE SHIELD | Attending: Emergency Medicine | Admitting: Emergency Medicine

## 2024-01-29 ENCOUNTER — Emergency Department (HOSPITAL_BASED_OUTPATIENT_CLINIC_OR_DEPARTMENT_OTHER): Payer: BLUE CROSS/BLUE SHIELD

## 2024-01-29 DIAGNOSIS — R1032 Left lower quadrant pain: Secondary | ICD-10-CM

## 2024-01-29 DIAGNOSIS — R112 Nausea with vomiting, unspecified: Secondary | ICD-10-CM | POA: Insufficient documentation

## 2024-01-29 DIAGNOSIS — R109 Unspecified abdominal pain: Secondary | ICD-10-CM | POA: Diagnosis present

## 2024-01-29 LAB — PREGNANCY, URINE: Preg Test, Ur: NEGATIVE

## 2024-01-29 LAB — URINALYSIS, ROUTINE W REFLEX MICROSCOPIC
Bilirubin Urine: NEGATIVE
Glucose, UA: NEGATIVE mg/dL
Hgb urine dipstick: NEGATIVE
Ketones, ur: NEGATIVE mg/dL
Leukocytes,Ua: NEGATIVE
Nitrite: NEGATIVE
Protein, ur: NEGATIVE mg/dL
Specific Gravity, Urine: 1.023 (ref 1.005–1.030)
pH: 6.5 (ref 5.0–8.0)

## 2024-01-29 LAB — COMPREHENSIVE METABOLIC PANEL
ALT: 23 U/L (ref 0–44)
AST: 18 U/L (ref 15–41)
Albumin: 4.4 g/dL (ref 3.5–5.0)
Alkaline Phosphatase: 57 U/L (ref 38–126)
Anion gap: 9 (ref 5–15)
BUN: 18 mg/dL (ref 6–20)
CO2: 27 mmol/L (ref 22–32)
Calcium: 9.7 mg/dL (ref 8.9–10.3)
Chloride: 102 mmol/L (ref 98–111)
Creatinine, Ser: 0.79 mg/dL (ref 0.44–1.00)
GFR, Estimated: >60 mL/min (ref >60)
Glucose, Bld: 89 mg/dL (ref 70–99)
Potassium: 3.7 mmol/L (ref 3.5–5.1)
Sodium: 138 mmol/L (ref 135–145)
Total Bilirubin: 0.5 mg/dL (ref 0.0–1.2)
Total Protein: 7.5 g/dL (ref 6.5–8.1)

## 2024-01-29 LAB — CBC
HCT: 42.2 % (ref 36.0–46.0)
Hemoglobin: 14.2 g/dL (ref 12.0–15.0)
MCH: 27.7 pg (ref 26.0–34.0)
MCHC: 33.6 g/dL (ref 30.0–36.0)
MCV: 82.3 fL (ref 80.0–100.0)
Platelets: 424 10*3/uL — ABNORMAL HIGH (ref 150–400)
RBC: 5.13 MIL/uL — ABNORMAL HIGH (ref 3.87–5.11)
RDW: 12.4 % (ref 11.5–15.5)
WBC: 12.9 10*3/uL — ABNORMAL HIGH (ref 4.0–10.5)
nRBC: 0 % (ref 0.0–0.2)

## 2024-01-29 LAB — LIPASE, BLOOD: Lipase: 23 U/L (ref 11–51)

## 2024-01-29 MED ORDER — IOHEXOL 350 MG/ML SOLN
100.0000 mL | Freq: Once | INTRAVENOUS | Status: AC | PRN
  Administered 2024-01-29: 100 mL via INTRAVENOUS

## 2024-01-29 MED ORDER — ONDANSETRON 4 MG PO TBDP: 4.0000 mg | ORAL_TABLET | Freq: Three times a day (TID) | ORAL | 0 refills | Status: AC | PRN

## 2024-01-29 MED ORDER — ONDANSETRON HCL 4 MG/2ML IJ SOLN
4.0000 mg | Freq: Once | INTRAMUSCULAR | Status: AC
  Administered 2024-01-29: 4 mg via INTRAVENOUS
  Filled 2024-01-29: qty 2

## 2024-01-29 MED ORDER — MORPHINE SULFATE (PF) 4 MG/ML IV SOLN
4.0000 mg | Freq: Once | INTRAVENOUS | Status: AC
  Administered 2024-01-29: 4 mg via INTRAVENOUS
  Filled 2024-01-29: qty 1

## 2024-01-29 NOTE — Discharge Instructions (Signed)
 You have been seen today for your complaint of abdominal pain. Your lab work was overall reassuring. Your imaging was reassuring and showed no abnormalities. Your discharge medications include Zofran .  This is a nausea medicine.  Take this as needed. Alternate tylenol  and ibuprofen  for pain. You may alternate these every 4 hours. You may take up to 800 mg of ibuprofen  at a time and up to 1000 mg of tylenol . Follow up with: Your PCP in 1 week if your symptoms persist Please seek immediate medical care if you develop any of the following symptoms: You cannot stop vomiting. Your pain is only in one part of the abdomen. Pain on the right side could be caused by appendicitis. You have bloody or black poop (stool), or poop that looks like tar. You have trouble breathing. You have chest pain. At this time there does not appear to be the presence of an emergent medical condition, however there is always the potential for conditions to change. Please read and follow the below instructions.  Do not take your medicine if  develop an itchy rash, swelling in your mouth or lips, or difficulty breathing; call 911 and seek immediate emergency medical attention if this occurs.  You may review your lab tests and imaging results in their entirety on your MyChart account.  Please discuss all results of fully with your primary care provider and other specialist at your follow-up visit.  Note: Portions of this text may have been transcribed using voice recognition software. Every effort was made to ensure accuracy; however, inadvertent computerized transcription errors may still be present.

## 2024-01-29 NOTE — ED Provider Notes (Signed)
 [place] EMERGENCY DEPARTMENT AT [facility] Provider Note   CSN: [id number] Arrival date & time: 01/29/24  1952     History  Chief Complaint  Patient presents with   Abdominal Pain    SARRAH Mcpherson is a 23 y.o. female.  With history of IBS, anxiety, depression presenting to the ED for evaluation of abdominal pain.  States she was eating dinner approximately 1 hour prior to arrival when she noticed sudden onset abdominal pain.  Today.  She is wondering if she could have ruptured cyst..  She reports 3 episodes of emesis similar to the pain.  She states breathing makes the pain worse.  She denies frequency, urgency, dysuria.  She is currently on her menstrual cycle.  No fevers or chills.  No other vaginal discharge.   Abdominal Pain Associated symptoms: nausea and vomiting        Home Medications Prior to Admission medications   Medication Sig Start Date End Date Taking? Authorizing Provider  ondansetron  (ZOFRAN -ODT) 4 MG disintegrating tablet Take 1 tablet (4 mg total) by mouth every 8 (eight) hours as needed for nausea or vomiting. 01/29/24  Yes Elnoria Livingston, Marsa HERO, PA-C  amphetamine -dextroamphetamine  (ADDERALL) 10 MG tablet TAKE ONE TABLET BY MOUTH TWICE A DAY AS DIRECTED 07/02/23   Neysa Rama D, MD  amphetamine -dextroamphetamine  (ADDERALL) 10 MG tablet TAKE ONE TABLET BY MOUTH TWICE A DAY AS DIRECTED 11/16/23   Cobb, Comer GAILS, NP  amphetamine -dextroamphetamine  (ADDERALL) 10 MG tablet TAKE ONE TABLET BY MOUTH TWICE A DAY AS DIRECTED 12/31/23   Neysa Rama D, MD  furosemide (LASIX) 20 MG tablet Take 20 mg by mouth daily. Patient not taking: Reported on 11/02/2023 05/12/22   [provider]  hydrOXYzine  (VISTARIL ) 25 MG capsule TAKE 1 CAPSULE EVERY 8 HOURS AS NEEDED FOR ANXIETY 03/19/23   Dixon, Phillip E, MD  metoprolol  tartrate (LOPRESSOR ) 25 MG tablet TAKE 1/2 TABLET BY MOUTH TWICE DAILY 04/14/22   Alvan Ronal BRAVO, MD  omeprazole  (PRILOSEC) 20 MG capsule Take  1 capsule (20 mg total) by mouth daily. 09/23/21   Carlan, Chelsea L, NP  ondansetron  (ZOFRAN ) 4 MG tablet Take 1 tablet (4 mg total) by mouth every 8 (eight) hours as needed for nausea or vomiting. 09/23/21   Carlan, Chelsea L, NP  sertraline (ZOLOFT) 50 MG tablet Take 50 mg by mouth daily. 10/30/21   [provider]      Allergies    Ketorolac  tromethamine , Gluten meal, and Reglan [metoclopramide]    Review of Systems   Review of Systems  Gastrointestinal:  Positive for abdominal pain, nausea and vomiting.  All other systems reviewed and are negative.   Physical Exam Updated Vital Signs BP 104/68   Pulse 96   Temp 98 F (36.7 C)   Resp (!) 23   LMP 01/29/2024   SpO2 98%  Physical Exam Vitals and nursing note reviewed.  Constitutional:      General: She is in acute distress.     Appearance: Normal appearance. She is obese.     Comments: Resting in bed, appears uncomfortable  HENT:     Head: Normocephalic and atraumatic.  Cardiovascular:     Rate and Rhythm: Normal rate.  Pulmonary:     Effort: Pulmonary effort is normal. No respiratory distress.  Abdominal:     General: Abdomen is flat.     Tenderness: There is generalized abdominal tenderness and tenderness in the left lower quadrant. There is guarding.  Musculoskeletal:  General: Normal range of motion.     Cervical back: Neck supple.  Skin:    General: Skin is warm and dry.  Neurological:     Mental Status: She is alert and oriented to person, place, and time.  Psychiatric:        Mood and Affect: Mood normal.        Behavior: Behavior normal.     ED Results / Procedures / Treatments   Labs (all labs ordered are listed, but only abnormal results are displayed) Labs Reviewed  CBC - Abnormal; Notable for the following components:      Result Value   WBC 12.9 (*)    RBC 5.13 (*)    Platelets 424 (*)    All other components within normal limits  LIPASE, BLOOD  COMPREHENSIVE METABOLIC PANEL   URINALYSIS, ROUTINE W REFLEX MICROSCOPIC  PREGNANCY, URINE    EKG EKG Interpretation Date/Time:  Friday January 29 2024 20:06:44 EST Ventricular Rate:  83 PR Interval:  146 QRS Duration:  95 QT Interval:  401 QTC Calculation: 472 R Axis:   84  Text Interpretation: Sinus rhythm No significant change since last tracing Confirmed by Doretha Folks ([id number]) on 01/29/2024 8:47:06 PM  Radiology CT ABDOMEN PELVIS W CONTRAST Result Date: 01/29/2024 CLINICAL DATA:  Left lower quadrant abdominal pain. EXAM: CT ABDOMEN AND PELVIS WITH CONTRAST TECHNIQUE: Multidetector CT imaging of the abdomen and pelvis was performed using the standard protocol following bolus administration of intravenous contrast. RADIATION DOSE REDUCTION: This exam was performed according to the departmental dose-optimization program which includes automated exposure control, adjustment of the mA and/or kV according to patient size and/or use of iterative reconstruction technique. CONTRAST:  OMNIPAQUE  IOHEXOL  350 MG/ML SOLN COMPARISON:  None Available. FINDINGS: Lower chest: No acute abnormality. Hepatobiliary: No focal liver abnormality is seen. No gallstones, gallbladder wall thickening, or biliary dilatation. Pancreas: Unremarkable. No pancreatic ductal dilatation or surrounding inflammatory changes. Spleen: Normal in size without focal abnormality. Adrenals/Urinary Tract: Adrenal glands are unremarkable. Kidneys are normal, without renal calculi, focal lesion, or hydronephrosis. Bladder is unremarkable. Stomach/Bowel: Stomach is within normal limits. Appendix appears normal. No evidence of bowel wall thickening, distention, or inflammatory changes. Vascular/Lymphatic: No significant vascular findings are present. No enlarged abdominal or pelvic lymph nodes. Reproductive: Uterus and bilateral adnexa are unremarkable. Other: No abdominal wall hernia or abnormality. No abdominopelvic ascites. Musculoskeletal: No acute or  significant osseous findings. IMPRESSION: No acute or active process within the abdomen or pelvis. Electronically Signed   By: Suzen Dials M.D.   On: 01/29/2024 23:30   US  PELVIC COMPLETE W TRANSVAGINAL AND TORSION R/O Result Date: 01/29/2024 CLINICAL DATA:  Left lower quadrant pain EXAM: TRANSABDOMINAL AND TRANSVAGINAL ULTRASOUND OF PELVIS DOPPLER ULTRASOUND OF OVARIES TECHNIQUE: Both transabdominal and transvaginal ultrasound examinations of the pelvis were performed. Transabdominal technique was performed for global imaging of the pelvis including uterus, ovaries, adnexal regions, and pelvic cul-de-sac. It was necessary to proceed with endovaginal exam following the transabdominal exam to visualize the uterus, endometrium, ovaries and adnexa. Color and duplex Doppler ultrasound was utilized to evaluate blood flow to the ovaries. COMPARISON:  None Available. FINDINGS: Uterus Measurements: 6.4 x 3.2 x 4.1 cm = volume: 45 mL. No fibroids or other mass visualized. Endometrium Thickness: 5 mm in thickness.  No focal abnormality visualized. Right ovary Measurements: 2.6 x 2.1 x 2.5 cm = volume: 7.3 mL. Normal appearance/no adnexal mass. Left ovary Measurements: 2.1 x 1.7 x 1.8 cm = volume: 3.4 mL.  Normal appearance/no adnexal mass. Multiple small follicles. Pulsed Doppler evaluation of both ovaries demonstrates normal low-resistance arterial and venous waveforms. Other findings No abnormal free fluid. IMPRESSION: Normal study. Electronically Signed   By: Franky Crease M.D.   On: 01/29/2024 21:32    Procedures Procedures    Medications Ordered in ED Medications  ondansetron  (ZOFRAN ) injection 4 mg (4 mg Intravenous Given 01/29/24 2033)  morphine  (PF) 4 MG/ML injection 4 mg (4 mg Intravenous Given 01/29/24 2034)  iohexol  (OMNIPAQUE ) 350 MG/ML injection 100 mL (100 mLs Intravenous Contrast Given 01/29/24 2319)    ED Course/ Medical Decision Making/ A&P                                 Medical Decision  Making Amount and/or Complexity of Data Reviewed Labs: ordered. Radiology: ordered.  Risk Prescription drug management.  This patient presents to the ED for concern of left lower quadrant abdominal pain, this involves an extensive number of treatment options, and is a complaint that carries with it a high risk of complications and morbidity.  Differential diagnosis of her lower abdominal considerations include pelvic inflammatory disease, ectopic pregnancy, appendicitis, urinary calculi, primary dysmenorrhea, septic abortion, ruptured ovarian cyst or tumor, ovarian torsion, tubo-ovarian abscess, degeneration of fibroid, endometriosis, diverticulitis, cystitis.   My initial workup includes labs, imaging, symptom control  Additional history obtained from: Nursing notes from this visit  I ordered, reviewed and interpreted labs which include: CBC, CMP, lipase, urinalysis, urine pregnancy.  Leukocytosis of 12.9.  No anemia.  Normal lipase.  No significant electrolyte derangement or kidney dysfunction.  Normal LFTs.  Urine without evidence of infection.  I ordered imaging studies including torsion study, CT abdomen pelvis I independently visualized and interpreted imaging which showed no acute abnormalities on ultrasound or CT of the abdomen and pelvis I agree with the radiologist interpretation  23 year old female presenting to the ED for evaluation of sudden onset left lower quadrant abdominal pain.  This occurred approximately 1 hour prior to my assessment.  Lab workup was reassuring.  Initially patient was very tender to palpation of her abdomen.  Reports improvement in her symptoms after treatment in the emergency department.  Ultrasound negative for evidence of torsion.  CT abdomen pelvis negative for acute intra-abdominal abnormalities.  Question ruptured ovarian cyst.  She was encouraged to follow-up with her primary care provider for reevaluation.  Prescription for Zofran  sent.  She was given  return precautions.  Stable at discharge.  At this time there does not appear to be any evidence of an acute emergency medical condition and the patient appears stable for discharge with appropriate outpatient follow up. Diagnosis was discussed with patient who verbalizes understanding of care plan and is agreeable to discharge. I have discussed return precautions with patient who verbalizes understanding. Patient encouraged to follow-up with their PCP within 1 week. All questions answered.  Case discussed with Dr. Doretha  Note: Portions of this report may have been transcribed using voice recognition software. Every effort was made to ensure accuracy; however, inadvertent computerized transcription errors may still be present.         Final Clinical Impression(s) / ED Diagnoses Final diagnoses:  LLQ abdominal pain    Rx / DC Orders ED Discharge Orders          Ordered    ondansetron  (ZOFRAN -ODT) 4 MG disintegrating tablet  Every 8 hours PRN  01/29/24 2347              Edwardo Marsa HERO, PA-C 01/29/24 2350    Doretha Folks, MD 02/02/24 [phone]

## 2024-01-29 NOTE — ED Triage Notes (Signed)
 Pt reports sudden mid lower and left lower abdominal pain approx 30 min PTA. Pain associated with vomiting. Difficulty moving s/t pain.

## 2024-02-24 ENCOUNTER — Ambulatory Visit
Admission: RE | Admit: 2024-02-24 | Discharge: 2024-02-24 | Disposition: A | Discharge status: Home / Self Care | Source: Ambulatory Visit | Attending: Emergency Medicine | Admitting: Emergency Medicine

## 2024-02-24 ENCOUNTER — Telehealth: Admitting: Physician Assistant

## 2024-02-24 ENCOUNTER — Other Ambulatory Visit: Payer: Self-pay

## 2024-02-24 VITALS — BP 122/78 | HR 79 | Temp 98.2°F | Resp 16

## 2024-02-24 DIAGNOSIS — R1110 Vomiting, unspecified: Secondary | ICD-10-CM

## 2024-02-24 DIAGNOSIS — R112 Nausea with vomiting, unspecified: Secondary | ICD-10-CM

## 2024-02-24 DIAGNOSIS — R519 Headache, unspecified: Secondary | ICD-10-CM | POA: Diagnosis not present

## 2024-02-24 DIAGNOSIS — R42 Dizziness and giddiness: Secondary | ICD-10-CM

## 2024-02-24 HISTORY — DX: Postural orthostatic tachycardia syndrome (POTS): G90.A

## 2024-02-24 MED ORDER — SUMATRIPTAN SUCCINATE 100 MG PO TABS: ORAL_TABLET | ORAL | 0 refills | Status: AC

## 2024-02-24 MED ORDER — SUMATRIPTAN SUCCINATE 6 MG/0.5ML ~~LOC~~ SOLN
6.0000 mg | Freq: Once | SUBCUTANEOUS | Status: AC
  Administered 2024-02-24: 6 mg via SUBCUTANEOUS

## 2024-02-24 MED ORDER — DEXAMETHASONE SODIUM PHOSPHATE 10 MG/ML IJ SOLN
10.0000 mg | Freq: Once | INTRAMUSCULAR | Status: AC
  Administered 2024-02-24: 10 mg via INTRAMUSCULAR

## 2024-02-24 MED ORDER — ONDANSETRON HCL 4 MG/2ML IJ SOLN
4.0000 mg | Freq: Once | INTRAMUSCULAR | Status: AC
  Administered 2024-02-24: 4 mg via INTRAMUSCULAR

## 2024-02-24 NOTE — Discharge Instructions (Addendum)
 The injections of sumatriptan and decadron should start to work in about 30 minutes. Keep drinking LOTS of fluids to keep hydrated, which will also decrease headache.  I have sent you a few tablets of the oral sumatriptan.  If you develop moderate or severe headache you can take a tablet.  If headache persists, you can repeat the dose in 2 hours. If headache still persists, please be evaluated in the urgent care or emergency department.   I have placed a referral to neurology. They will call you to make an appointment.

## 2024-02-24 NOTE — ED Provider Notes (Signed)
 [facility]    CSN: [id number] Arrival date & time: 02/24/24  1858     History   Chief Complaint Chief Complaint  Patient presents with   Migraine    Nausea Vomit x3 Dizziness - Entered by patient    HPI Dawn Mcpherson is a 23 y.o. female.  3 day history of constant headache, and today developed nausea and vomiting. Headache rated 8/10 at this time. Photosensitivity. Also has been feeling dizziness and reports dehydration. Has used zofran ODT, last dose was 7 hours ago. Has not tried oral fluids yet since last emesis. Continued nausea despite the zofran. No fever or chills Denies head injury. No vision changes. Not having neck pain or stiffness, no back pain.  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   POTS (postural orthostatic tachycardia syndrome) 11/02/2023   Healthcare maintenance 11/02/2023   Generalized body aches 08/08/2022   Annual physical exam 07/29/2022   Missed period 07/29/2022   Amenorrhea 07/29/2022   School health examination 07/29/2022   Sleep disorder 05/22/2022   URI (upper respiratory infection) 12/19/2021   PSVT (paroxysmal supraventricular tachycardia) (HCC) 12/02/2021   Chest pain 12/02/2021   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 Clinton Sawyer   INCISION AND DRAINAGE      OB History     Gravida  0   Para  0   Term  0   Preterm  0   AB  0   Living  0      SAB  0   IAB  0   Ectopic  0   Multiple  0   Live Births  0            Home Medications    Prior to Admission medications   Medication Sig Start Date End Date Taking? Authorizing Provider  SUMAtriptan (IMITREX) 100 MG tablet Take 1 tablet by mouth for moderate-severe headache. May repeat in 2 hours if headache persists or recurs. 02/24/24  Yes Daryan Cagley, Lurena Joiner, PA-C  amphetamine-dextroamphetamine (ADDERALL) 10 MG tablet TAKE ONE TABLET BY MOUTH TWICE A DAY AS DIRECTED 07/02/23   Jetty Duhamel D, MD  amphetamine-dextroamphetamine (ADDERALL) 10 MG tablet TAKE ONE TABLET BY MOUTH TWICE A DAY AS DIRECTED 11/16/23   Cobb, Ruby Cola, NP  amphetamine-dextroamphetamine (ADDERALL) 10 MG tablet TAKE ONE TABLET BY MOUTH TWICE A DAY AS DIRECTED 12/31/23   Waymon Budge, MD  furosemide (LASIX) 20 MG tablet  Take 20 mg by mouth daily. Patient not taking: Reported on 11/02/2023 05/12/22   [provider]  hydrOXYzine (VISTARIL) 25 MG capsule TAKE 1 CAPSULE EVERY 8 HOURS AS NEEDED FOR ANXIETY 03/19/23   Billie Lade, MD  metoprolol tartrate (LOPRESSOR) 25 MG tablet TAKE 1/2 TABLET BY MOUTH TWICE DAILY 04/14/22   Maisie Fus, MD  omeprazole (PRILOSEC) 20 MG capsule Take 1 capsule (20 mg total) by mouth daily. 09/23/21   Carlan, Chelsea L, NP  ondansetron (ZOFRAN) 4 MG tablet Take 1 tablet (4 mg total) by mouth every 8 (eight) hours as needed for nausea or vomiting. 09/23/21   Carlan, Chelsea L, NP  ondansetron (ZOFRAN-ODT) 4 MG  disintegrating tablet Take 1 tablet (4 mg total) by mouth every 8 (eight) hours as needed for nausea or vomiting. 01/29/24   Schutt, Edsel Petrin, PA-C  sertraline (ZOLOFT) 50 MG tablet Take 50 mg by mouth daily. 10/30/21   [provider]    Family History Family History  Problem Relation Age of Onset   Hypertension Mother    Depression Mother    Diabetes Father    Healthy Sister    Healthy Sister    Healthy Sister    Multiple sclerosis Maternal Grandmother    Diabetes Maternal Grandfather    Thyroid disease Maternal Grandfather    Breast cancer Paternal Grandmother    Bladder Cancer Paternal Grandfather    Diabetes Paternal Grandfather    Leukemia Paternal Grandfather     Social History Social History   Tobacco Use   Smoking status: Never    Passive exposure: Yes   Smokeless tobacco: Never  Vaping Use   Vaping status: Never Used  Substance Use Topics   Alcohol use: No   Drug use: No     Allergies   Ketorolac tromethamine, Gluten meal, and Reglan [metoclopramide]   Review of Systems Review of Systems Per HPI  Physical Exam Triage Vital Signs ED Triage Vitals  Encounter Vitals Group     BP 02/24/24 1909 122/78     Systolic BP Percentile --      Diastolic BP Percentile --      Pulse Rate 02/24/24 1909 79     Resp 02/24/24 1909 16     Temp 02/24/24 1906 98.2 F (36.8 C)     Temp Source 02/24/24 1906 Oral     SpO2 02/24/24 1909 96 %     Weight --      Height --      Head Circumference --      Peak Flow --      Pain Score 02/24/24 1910 6     Pain Loc --      Pain Education --      Exclude from Growth Chart --    No data found.  Updated Vital Signs BP 122/78   Pulse 79   Temp 98.2 F (36.8 C)   Resp 16   LMP 01/29/2024   SpO2 96%    Physical Exam Vitals and nursing note reviewed.  Constitutional:      General: She is not in acute distress.    Comments: Initially ill-appearing, laying on exam table, appears uncomfortable. After  medicines, upright on table sipping fluids  HENT:     Head: Atraumatic.     Mouth/Throat:     Mouth: Mucous membranes are moist.     Pharynx: Oropharynx is clear.  Eyes:     Extraocular Movements: Extraocular movements intact.  Conjunctiva/sclera: Conjunctivae normal.     Pupils: Pupils are equal, round, and reactive to light.  Cardiovascular:     Rate and Rhythm: Normal rate and regular rhythm.     Heart sounds: Normal heart sounds.  Pulmonary:     Effort: Pulmonary effort is normal.     Breath sounds: Normal breath sounds.  Musculoskeletal:        General: Normal range of motion.     Cervical back: Normal range of motion. No rigidity.  Skin:    General: Skin is warm and dry.  Neurological:     Mental Status: She is alert and oriented to person, place, and time.     Sensory: No sensory deficit.     Motor: No weakness.     Coordination: Coordination normal.     Gait: Gait normal.     UC Treatments / Results  Labs (all labs ordered are listed, but only abnormal results are displayed) Labs Reviewed - No data to display  EKG   Radiology No results found.  Procedures Procedures (including critical care time)  Medications Ordered in UC Medications  ondansetron (ZOFRAN) injection 4 mg (4 mg Intramuscular Given 02/24/24 1917)  dexamethasone (DECADRON) injection 10 mg (10 mg Intramuscular Given 02/24/24 1950)  SUMAtriptan (IMITREX) injection 6 mg (6 mg Subcutaneous Given 02/24/24 1950)    Initial Impression / Assessment and Plan / UC Course  I have reviewed the triage vital signs and the nursing notes.  Pertinent labs & imaging results that were available during my care of the patient were reviewed by me and considered in my medical decision making (see chart for details).  IM zofran given and nausea resolved PO challenge successful IM decadron and sumatriptan given for headache Patient is feeling much better and is well appearing, alert and smiling now. I have sent an  imitrex prescription to pharmacy with instructions for use and strict return/ED precautions.  Neurology referral placed Patient agreeable to plan, no questions at this time   Final Clinical Impressions(s) / UC Diagnoses   Final diagnoses:  Bad headache  Nausea and vomiting, unspecified vomiting type     Discharge Instructions      The injections of sumatriptan and decadron should start to work in about 30 minutes. Keep drinking LOTS of fluids to keep hydrated, which will also decrease headache.  I have sent you a few tablets of the oral sumatriptan.  If you develop moderate or severe headache you can take a tablet.  If headache persists, you can repeat the dose in 2 hours. If headache still persists, please be evaluated in the urgent care or emergency department.   I have placed a referral to neurology. They will call you to make an appointment.     ED Prescriptions     Medication Sig Dispense Auth. Provider   SUMAtriptan (IMITREX) 100 MG tablet Take 1 tablet by mouth for moderate-severe headache. May repeat in 2 hours if headache persists or recurs. 10 tablet Anisten Tomassi, Lurena Joiner, PA-C      PDMP not reviewed this encounter.   Geovani Tootle, Lurena Joiner, [place] 02/24/24 2030

## 2024-02-24 NOTE — ED Triage Notes (Signed)
 Since Monday has had Migraine, n/v. Has hx of pots. Has taken zofran.

## 2024-02-24 NOTE — Progress Notes (Signed)
  Because of concern for dehydration and active dizziness, I feel your condition warrants further evaluation and I recommend that you be seen in a face-to-face visit.   NOTE: There will be NO CHARGE for this E-Visit   If you are having a true medical emergency, please call 911.     For an urgent face to face visit, [facility] has multiple urgent care centers for your convenience.  Click the link below for the full list of locations and hours, walk-in wait times, appointment scheduling options and driving directions:  Urgent Care - [place], [place], [place], [place], [place], [place]  [place]     Your MyChart E-visit questionnaire answers were reviewed by a board certified advanced clinical practitioner to complete your personal care plan based on your specific symptoms.    Thank you for using e-Visits.

## 2024-03-11 ENCOUNTER — Other Ambulatory Visit: Payer: Self-pay | Admitting: Internal Medicine

## 2024-03-14 MED ORDER — AMPHETAMINE-DEXTROAMPHETAMINE 10 MG PO TABS: ORAL_TABLET | ORAL | 0 refills | Status: DC

## 2024-03-14 NOTE — Telephone Encounter (Signed)
 Adderall refilled

## 2024-03-14 NOTE — Telephone Encounter (Signed)
For Dr.Young

## 2024-03-14 NOTE — Telephone Encounter (Signed)
 Please advise controlled substance refill request. Pt was last seen by Rhunette Croft, NP on 11-01-13

## 2024-03-29 ENCOUNTER — Ambulatory Visit: Admitting: Neurology

## 2024-04-27 ENCOUNTER — Other Ambulatory Visit: Payer: Self-pay | Admitting: Internal Medicine

## 2024-04-27 MED ORDER — AMPHETAMINE-DEXTROAMPHETAMINE 10 MG PO TABS
ORAL_TABLET | ORAL | 0 refills | Status: DC
Start: 2024-04-27 — End: 2024-05-30

## 2024-04-27 NOTE — Telephone Encounter (Signed)
**Note De-identified  [place] Obfuscation** Please advise 

## 2024-04-27 NOTE — Telephone Encounter (Signed)
 Adderall refilled
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
## 2024-05-02 ENCOUNTER — Ambulatory Visit: Payer: PRIVATE HEALTH INSURANCE | Admitting: Internal Medicine

## 2024-05-25 ENCOUNTER — Encounter (HOSPITAL_COMMUNITY): Payer: Self-pay | Admitting: *Deleted

## 2024-05-25 ENCOUNTER — Emergency Department (HOSPITAL_COMMUNITY)
Admission: EM | Admit: 2024-05-25 | Discharge: 2024-05-25 | Disposition: A | Discharge status: Home / Self Care | Attending: Emergency Medicine | Admitting: Emergency Medicine

## 2024-05-25 ENCOUNTER — Other Ambulatory Visit: Payer: Self-pay

## 2024-05-25 ENCOUNTER — Emergency Department (HOSPITAL_COMMUNITY)

## 2024-05-25 DIAGNOSIS — R55 Syncope and collapse: Secondary | ICD-10-CM | POA: Insufficient documentation

## 2024-05-25 HISTORY — DX: Supraventricular tachycardia, unspecified: I47.10

## 2024-05-25 HISTORY — DX: Unspecified atrial fibrillation: I48.91

## 2024-05-25 LAB — CBC WITH DIFFERENTIAL/PLATELET
Abs Immature Granulocytes: 0.06 10*3/uL (ref 0.00–0.07)
Basophils Absolute: 0 10*3/uL (ref 0.0–0.1)
Basophils Relative: 0 %
Eosinophils Absolute: 0 10*3/uL (ref 0.0–0.5)
Eosinophils Relative: 0 %
HCT: 42.2 % (ref 36.0–46.0)
Hemoglobin: 14.1 g/dL (ref 12.0–15.0)
Immature Granulocytes: 0 %
Lymphocytes Relative: 13 %
Lymphs Abs: 1.8 10*3/uL (ref 0.7–4.0)
MCH: 27.4 pg (ref 26.0–34.0)
MCHC: 33.4 g/dL (ref 30.0–36.0)
MCV: 81.9 fL (ref 80.0–100.0)
Monocytes Absolute: 0.4 10*3/uL (ref 0.1–1.0)
Monocytes Relative: 3 %
Neutro Abs: 11.5 10*3/uL — ABNORMAL HIGH (ref 1.7–7.7)
Neutrophils Relative %: 84 %
Platelets: 337 10*3/uL (ref 150–400)
RBC: 5.15 MIL/uL — ABNORMAL HIGH (ref 3.87–5.11)
RDW: 12.2 % (ref 11.5–15.5)
WBC: 13.8 10*3/uL — ABNORMAL HIGH (ref 4.0–10.5)
nRBC: 0 % (ref 0.0–0.2)

## 2024-05-25 LAB — COMPREHENSIVE METABOLIC PANEL WITH GFR
ALT: 19 U/L (ref 0–44)
AST: 13 U/L — ABNORMAL LOW (ref 15–41)
Albumin: 3.5 g/dL (ref 3.5–5.0)
Alkaline Phosphatase: 56 U/L (ref 38–126)
Anion gap: 3 — ABNORMAL LOW (ref 5–15)
BUN: 12 mg/dL (ref 6–20)
CO2: 25 mmol/L (ref 22–32)
Calcium: 8.5 mg/dL — ABNORMAL LOW (ref 8.9–10.3)
Chloride: 108 mmol/L (ref 98–111)
Creatinine, Ser: 0.74 mg/dL (ref 0.44–1.00)
GFR, Estimated: >60 mL/min (ref >60)
Glucose, Bld: 103 mg/dL — ABNORMAL HIGH (ref 70–99)
Potassium: 4.4 mmol/L (ref 3.5–5.1)
Sodium: 136 mmol/L (ref 135–145)
Total Bilirubin: 0.4 mg/dL (ref 0.0–1.2)
Total Protein: 6.6 g/dL (ref 6.5–8.1)

## 2024-05-25 LAB — LIPASE, BLOOD: Lipase: 41 U/L (ref 11–51)

## 2024-05-25 LAB — MAGNESIUM: Magnesium: 2 mg/dL (ref 1.7–2.4)

## 2024-05-25 LAB — HCG, SERUM, QUALITATIVE: Preg, Serum: NEGATIVE

## 2024-05-25 MED ORDER — SODIUM CHLORIDE 0.9 % IV BOLUS
1000.0000 mL | Freq: Once | INTRAVENOUS | Status: AC
  Administered 2024-05-25: 1000 mL via INTRAVENOUS

## 2024-05-25 NOTE — ED Notes (Signed)
 Pt brought back to ED room from XR.

## 2024-05-25 NOTE — ED Provider Notes (Signed)
 [place] [facility]PARTMENT AT [facility] Provider Note   CSN: [id number] Arrival date & time: 05/25/24  1010     History  Chief Complaint  Patient presents with   Near Syncope    Dawn Mcpherson is a 23 y.o. female.  HPI 23 year old female presents with near syncope.  Patient has a history of POTS and has similar prior episodes but never 1 this bad.  Patient states she was at an award function and was seated when she started to laugh and then while laughing started to throw up.  She ended up going to the bathroom and continued to have vomiting and was feeling very lightheaded but does not think she passed out.  She was feeling palpitations but no chest pain, shortness of breath.  No headache.  Previous to this a few days ago she had a URI but those symptoms are gone.  She had some upper abdominal pain while she was vomiting but that is gone now.  She is currently on her menstrual cycle.  She feels a little bit of residual palpitations but right now feels like her chest is unremarkable. No urinary symptoms.  Typically she will get nausea and palpitations but has never had this much vomiting and the palpitations seem stronger today.  Home Medications Prior to Admission medications   Medication Sig Start Date End Date Taking? Authorizing Provider  amphetamine -dextroamphetamine  (ADDERALL) 10 MG tablet TAKE ONE TABLET BY MOUTH TWICE A DAY AS DIRECTED 07/02/23   Rosa College D, MD  amphetamine -dextroamphetamine  (ADDERALL) 10 MG tablet TAKE ONE TABLET BY MOUTH TWICE A DAY AS DIRECTED 11/16/23   Cobb, Mariah Shines, NP  amphetamine -dextroamphetamine  (ADDERALL) 10 MG tablet TAKE ONE TABLET BY MOUTH TWICE A DAY AS DIRECTED 04/27/24   Rosa College D, MD  furosemide (LASIX) 20 MG tablet Take 20 mg by mouth daily. Patient not taking: Reported on 11/02/2023 05/12/22   [provider]  hydrOXYzine  (VISTARIL ) 25 MG capsule TAKE 1 CAPSULE EVERY 8 HOURS AS NEEDED FOR ANXIETY 03/19/23    Dixon, Phillip E, MD  metoprolol  tartrate (LOPRESSOR ) 25 MG tablet TAKE 1/2 TABLET BY MOUTH TWICE DAILY 04/14/22   Bridgette Campus, MD  omeprazole  (PRILOSEC) 20 MG capsule Take 1 capsule (20 mg total) by mouth daily. 09/23/21   Carlan, Chelsea L, NP  ondansetron  (ZOFRAN ) 4 MG tablet Take 1 tablet (4 mg total) by mouth every 8 (eight) hours as needed for nausea or vomiting. 09/23/21   Carlan, Chelsea L, NP  ondansetron  (ZOFRAN -ODT) 4 MG disintegrating tablet Take 1 tablet (4 mg total) by mouth every 8 (eight) hours as needed for nausea or vomiting. 01/29/24   Schutt, Coni Deep, PA-C  sertraline (ZOLOFT) 50 MG tablet Take 50 mg by mouth daily. 10/30/21   [provider]  SUMAtriptan  (IMITREX ) 100 MG tablet Take 1 tablet by mouth for moderate-severe headache. May repeat in 2 hours if headache persists or recurs. 02/24/24   Rising, Ivette Marks, PA-C      Allergies    Ketorolac  tromethamine , Gluten meal, and Reglan [metoclopramide]    Review of Systems   Review of Systems  Respiratory:  Negative for shortness of breath.   Cardiovascular:  Positive for palpitations. Negative for chest pain.  Gastrointestinal:  Positive for vomiting.  Neurological:  Positive for light-headedness. Negative for syncope and headaches.    Physical Exam Updated Vital Signs BP 114/75   Pulse 90   Temp 98.1 F (36.7 C) (Oral)   Resp 18  Ht 5\' 3"  (1.6 m)   Wt 97.5 kg   LMP 05/23/2024   SpO2 97%   BMI 38.09 kg/m  Physical Exam Vitals and nursing note reviewed.  Constitutional:      Appearance: She is well-developed. She is obese.  HENT:     Head: Normocephalic and atraumatic.  Eyes:     Extraocular Movements: Extraocular movements intact.     Pupils: Pupils are equal, round, and reactive to light.  Cardiovascular:     Rate and Rhythm: Normal rate and regular rhythm.     Heart sounds: Normal heart sounds. No murmur heard. Pulmonary:     Effort: Pulmonary effort is normal.     Breath sounds: Normal breath  sounds.  Abdominal:     Palpations: Abdomen is soft.     Tenderness: There is no abdominal tenderness.  Skin:    General: Skin is warm and dry.  Neurological:     Mental Status: She is alert.     Comments: CN 3-12 grossly intact. 5/5 strength in all 4 extremities. Grossly normal sensation. Normal finger to nose.      ED Results / Procedures / Treatments   Labs (all labs ordered are listed, but only abnormal results are displayed) Labs Reviewed  COMPREHENSIVE METABOLIC PANEL WITH GFR - Abnormal; Notable for the following components:      Result Value   Glucose, Bld 103 (*)    Calcium 8.5 (*)    AST 13 (*)    Anion gap 3 (*)    All other components within normal limits  CBC WITH DIFFERENTIAL/PLATELET - Abnormal; Notable for the following components:   WBC 13.8 (*)    RBC 5.15 (*)    Neutro Abs 11.5 (*)    All other components within normal limits  MAGNESIUM  LIPASE, BLOOD  HCG, SERUM, QUALITATIVE    EKG EKG Interpretation Date/Time:  Wednesday May 25 2024 10:20:57 EDT Ventricular Rate:  84 PR Interval:  133 QRS Duration:  89 QT Interval:  375 QTC Calculation: 444 R Axis:   70  Text Interpretation: Sinus rhythm no acute ST/T changes Confirmed by Jerilynn Montenegro [phone]) on 05/25/2024 10:44:00 AM  Radiology DG Chest 2 View Result Date: 05/25/2024 CLINICAL DATA:  near syncope EXAM: CHEST - 2 VIEW COMPARISON:  03/19/2022 FINDINGS: Low lung volumes.  Lungs clear. Heart size and mediastinal contours are within normal limits. No effusion. Visualized bones unremarkable. IMPRESSION: Low volumes. No acute findings. Electronically Signed   By: Nicoletta Barrier M.D.   On: 05/25/2024 11:27    Procedures Procedures    Medications Ordered in ED Medications  sodium chloride  0.9 % bolus 1,000 mL (0 mLs Intravenous Stopped 05/25/24 1140)    ED Course/ Medical Decision Making/ A&P                                 Medical Decision Making Amount and/or Complexity of Data Reviewed Labs:  ordered.    Details: Unremarkable electrolytes. Radiology: ordered and independent interpretation performed.    Details: No CHF ECG/medicine tests: ordered and independent interpretation performed.    Details: No arrhythmia   Patient presents with vomiting and near syncope.  I suspect her leukocytosis is from multiple episodes of vomiting as she otherwise has an unremarkable history and exam.  She did not actually pass out.  Does have a history of arrhythmias and POTS but at this point her vitals are normal,  she feels better with fluids, and there is nothing else concerning on her history or physical.  She has not had any chest pain so I think ACS is highly unlikely.  Highly doubt PE.  Will have her follow-up with her PCP but at this point she appears stable.  Has a benign abdominal exam.        Final Clinical Impression(s) / ED Diagnoses Final diagnoses:  Near syncope    Rx / DC Orders ED Discharge Orders     None         Jerilynn Montenegro, MD 05/25/24 [phone]

## 2024-05-25 NOTE — ED Notes (Signed)
 Patient transported to X-ray

## 2024-05-25 NOTE — ED Notes (Signed)
 ED Provider at bedside.

## 2024-05-25 NOTE — ED Triage Notes (Addendum)
 Pt brought in by [facility] from an elementary school after she had a near syncopal episode and 1 episode of vomiting while at an awards ceremony. EMS reports pt was feeling fine and then the symptoms started all of a sudden. Hx of POTS. EMS reports on the way to the ED pt became less responsive, BP 80/40, and had another near syncopal episode. 20g IV started in left a/c and 250ml of NS given by EMS PTA. Pt awake, but appears lethargic upon arrival to ED.

## 2024-05-25 NOTE — Discharge Instructions (Signed)
 Be sure to drink plenty of fluids and eat and drink appropriately.  Otherwise follow-up with your primary care physician.  If you develop new palpitations, feels like you are in a pass out, chest pain, shortness of breath, or any other new/concerning symptoms then return to the ER for evaluation.

## 2024-05-28 ENCOUNTER — Other Ambulatory Visit: Payer: Self-pay | Admitting: Internal Medicine

## 2024-05-30 NOTE — Telephone Encounter (Signed)
 Adderall refilled

## 2024-07-15 ENCOUNTER — Other Ambulatory Visit: Payer: Self-pay | Admitting: Internal Medicine

## 2024-07-18 NOTE — Telephone Encounter (Signed)
 Adderall refilled

## 2024-07-18 NOTE — Telephone Encounter (Signed)
 Please route to Dr. Neysa as he has been refilling.

## 2024-07-18 NOTE — Telephone Encounter (Signed)
**Note De-identified  [place] Obfuscation** Please advise 

## 2024-08-30 ENCOUNTER — Ambulatory Visit: Payer: Self-pay | Admitting: Neurology

## 2024-08-30 ENCOUNTER — Encounter: Payer: Self-pay | Admitting: Neurology

## 2024-08-30 NOTE — Progress Notes (Deleted)
 [facility]    Provider:  Dr Ines Requesting Provider: Jeryl Asberry RIGGERS Primary Care Provider:  Elnor Fairy HERO, NP  CC:  ***  HPI:  Dawn Mcpherson is a 23 y.o. female here as requested by Rising, Asberry, PA-C for severe headaches. has MDD (major depressive disorder), single episode, severe , no psychosis (HCC); Generalized anxiety disorder; Encounter to establish care; ADHD; Weight gain, abnormal; Hidradenitis suppurativa; Acute serous otitis media of left ear without rupture; Constipation; Left foot pain; Non-celiac gluten sensitivity; Right lower quadrant abdominal pain; Nausea and vomiting; PSVT (paroxysmal supraventricular tachycardia) (HCC); Chest pain; URI (upper respiratory infection); Sleep disorder; Annual physical exam; Missed period; Amenorrhea; School health examination; Generalized body aches; Narcolepsy and cataplexy; POTS (postural orthostatic tachycardia syndrome); and Healthcare maintenance on their problem list.   Reviewed notes, labs and imaging from outside physicians, which showed ***  Dawn Mcpherson 05/26/2023: Pt brought in by [facility] from an elementary school after she had a near syncopal episode and 1 episode of vomiting while at an awards ceremony. EMS reports pt was feeling fine and then the symptoms started all of a sudden. Hx of POTS. EMS reports on the way to the ED pt became less responsive, BP 80/40, and had another near syncopal episode. 20g IV started in left a/c and 250ml of NS given by EMS PTA. Pt awake, but appears lethargic upon arrival to ED.   From a thorough review of records and patient report, Medications tried that can be used in migraine/headache management greater than 3 months include: Lifestyle modification, headache diaries, better sleep hygiene, exercise, management of migraine triggers, OTC and prescribed analgesics/nsaids such as ibuprofen , excedrin, alleve and others, lexapro , amitriptyline/nortrip contraindicated due to being  on lexapro  and risk of seratonin syndrome, metoprolol , zofran , seroquel, sumatriptan   08/30/2021: CLINICAL DATA:  Head injury, vomiting: reviewed images,   Ct showed No acute intracranial abnormalities including mass lesion or mass effect, hydrocephalus, extra-axial fluid collection, midline shift, hemorrhage, or acute infarction, large ischemic events (personally reviewed images)     EXAM: CT HEAD WITHOUT CONTRAST   TECHNIQUE: Contiguous axial images were obtained from the base of the skull through the vertex without intravenous contrast.   COMPARISON:  None.   FINDINGS: Brain: Normal anatomic configuration. No abnormal intra or extra-axial mass lesion or fluid collection. No abnormal mass effect or midline shift. No evidence of acute intracranial hemorrhage or infarct. Ventricular size is normal. Cerebellum unremarkable.   Vascular: Unremarkable   Skull: Intact   Sinuses/Orbits: Paranasal sinuses are clear. Orbits are unremarkable.   Other: Mastoid air cells and middle ear cavities are clear.   IMPRESSION: No acute intracranial abnormality.  Unremarkable examination.  From a thorough review of records and patient report, Medications tried that can be used in migraine/headache management greater than 3 months include: Lifestyle modification, headache diaries, better sleep hygiene, exercise, management of migraine triggers, OTC and prescribed analgesics/nsaids such as ibuprofen , excedrin, alleve and others, Aimovig contraindicated due to constipation  Review of Systems: Patient complains of symptoms per HPI as well as the following symptoms ***. Pertinent negatives and positives per HPI. All others negative.   Social History   Socioeconomic History   Marital status: Single    Spouse name: Not on file   Number of children: 0   Years of education: Not on file   Highest education level: Not on file  Occupational History   Occupation: [employer]    Comment: Research scientist (medical)  Tobacco Use  Smoking status: Never    Passive exposure: Yes   Smokeless tobacco: Never  Vaping Use   Vaping status: Never Used  Substance and Sexual Activity   Alcohol use: No   Drug use: No   Sexual activity: Yes    Birth control/protection: None  Other Topics Concern   Not on file  Social History Narrative   Lives with both parents.    Social Drivers of Corporate investment banker Strain: Low Risk  (04/25/2024)   Received from [facility]   Overall Financial Resource Strain (CARDIA)    Difficulty of Paying Living Expenses: Not hard at all  Food Insecurity: No Food Insecurity (04/25/2024)   Received from [facility]   Hunger Vital Sign    Within the past 12 months, you worried that your food would run out before you got the money to buy more.: Never true    Within the past 12 months, the food you bought just didn't last and you didn't have money to get more.: Never true  Transportation Needs: No Transportation Needs (04/25/2024)   Received from [facility] - Transportation    Lack of Transportation (Medical): No    Lack of Transportation (Non-Medical): No  Physical Activity: Insufficiently Active (04/25/2024)   Received from [facility]   Exercise Vital Sign    On average, how many days per week do you engage in moderate to strenuous exercise (like a brisk walk)?: 3 days    On average, how many minutes do you engage in exercise at this level?: 20 min  Stress: Stress Concern Present (04/25/2024)   Received from [facility] - Occupational Stress Questionnaire    Feeling of Stress : To some extent  Social Connections: Socially Integrated (04/25/2024)   Received from [facility]   Social Network    How would you rate your social network (family, work, friends)?: Good participation with social networks  Intimate Partner Violence: Not At Risk (04/25/2024)   Received from [facility]   HITS    Over the last  12 months how often did your partner physically hurt you?: Never    Over the last 12 months how often did your partner insult you or talk down to you?: Never    Over the last 12 months how often did your partner threaten you with physical harm?: Never    Over the last 12 months how often did your partner scream or curse at you?: Never    Family History  Problem Relation Age of Onset   Hypertension Mother    Depression Mother    Diabetes Father    Healthy Sister    Healthy Sister    Healthy Sister    Multiple sclerosis Maternal Grandmother    Diabetes Maternal Grandfather    Thyroid  disease Maternal Grandfather    Breast cancer Paternal Grandmother    Bladder Cancer Paternal Grandfather    Diabetes Paternal Grandfather    Leukemia Paternal Grandfather     Past Medical History:  Diagnosis Date   Anxiety    Atrial fibrillation, transient (HCC)    Depression    Depression    Phreesia 03/06/2021   Depression    Phreesia 04/30/2021   Headache(784.0)    IBS (irritable bowel syndrome)    POTS (postural orthostatic tachycardia syndrome)    SVT (supraventricular tachycardia) [facility])     Patient Active Problem List   Diagnosis Date Noted   Narcolepsy  and cataplexy 11/02/2023   POTS (postural orthostatic tachycardia syndrome) 11/02/2023   Healthcare maintenance 11/02/2023   Generalized body aches 08/08/2022   Annual physical exam 07/29/2022   Missed period 07/29/2022   Amenorrhea 07/29/2022   School health examination 07/29/2022   Sleep disorder 05/22/2022   URI (upper respiratory infection) 12/19/2021   PSVT (paroxysmal supraventricular tachycardia) (HCC) 12/02/2021   Chest pain 12/02/2021   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   Right lower quadrant abdominal pain 09/23/2021   Nausea and vomiting 09/23/2021   Left foot pain 06/18/2021   Acute serous otitis media of left ear without rupture 05/03/2021   Constipation 05/03/2021   Encounter to establish care 03/06/2021    ADHD 03/06/2021   Weight gain, abnormal 03/06/2021   Hidradenitis suppurativa 03/06/2021   MDD (major depressive disorder), single episode, severe , no psychosis (HCC) 09/20/2014   Generalized anxiety disorder 09/20/2014    Past Surgical History:  Procedure Laterality Date   COLONOSCOPY WITH ESOPHAGOGASTRODUODENOSCOPY (EGD)  07/2020   [facility], Dr. Billy   INCISION AND DRAINAGE      Current Outpatient Medications  Medication Sig Dispense Refill   amphetamine -dextroamphetamine  (ADDERALL) 10 MG tablet TAKE ONE TABLET BY MOUTH TWICE A DAY AS DIRECTED 60 tablet 0   amphetamine -dextroamphetamine  (ADDERALL) 10 MG tablet TAKE ONE TABLET BY MOUTH TWICE A DAY AS DIRECTED 60 tablet 0   amphetamine -dextroamphetamine  (ADDERALL) 10 MG tablet TAKE 1 TABLET BY MOUTH TWICE DAILY AS DIRECTED 60 tablet 0   amphetamine -dextroamphetamine  (ADDERALL) 10 MG tablet TAKE 1 TABLET BY MOUTH TWICE DAILY AS DIRECTED 60 tablet 0   furosemide (LASIX) 20 MG tablet Take 20 mg by mouth daily. (Patient not taking: Reported on 11/02/2023)     hydrOXYzine  (VISTARIL ) 25 MG capsule TAKE 1 CAPSULE EVERY 8 HOURS AS NEEDED FOR ANXIETY 14 capsule 0   metoprolol  tartrate (LOPRESSOR ) 25 MG tablet TAKE 1/2 TABLET BY MOUTH TWICE DAILY 90 tablet 3   omeprazole  (PRILOSEC) 20 MG capsule Take 1 capsule (20 mg total) by mouth daily. 90 capsule 3   ondansetron  (ZOFRAN ) 4 MG tablet Take 1 tablet (4 mg total) by mouth every 8 (eight) hours as needed for nausea or vomiting. 15 tablet 1   ondansetron  (ZOFRAN -ODT) 4 MG disintegrating tablet Take 1 tablet (4 mg total) by mouth every 8 (eight) hours as needed for nausea or vomiting. 20 tablet 0   sertraline (ZOLOFT) 50 MG tablet Take 50 mg by mouth daily.     SUMAtriptan  (IMITREX ) 100 MG tablet Take 1 tablet by mouth for moderate-severe headache. May repeat in 2 hours if headache persists or recurs. 10 tablet 0   No current facility-administered medications for this visit.    Allergies  as of 08/30/2024 - Review Complete 05/25/2024  Allergen Reaction Noted   Ketorolac  tromethamine  Palpitations 01/13/2022   Gluten meal  05/03/2021   Reglan [metoclopramide] Palpitations 11/04/2021    Vitals: There were no vitals taken for this visit. Last Weight:  Wt Readings from Last 1 Encounters:  05/25/24 215 lb (97.5 kg)   Last Height:   Ht Readings from Last 1 Encounters:  05/25/24 5' 3 (1.6 m)     Physical exam: Exam: Gen: NAD, conversant, well nourised, obese, well groomed                     CV: RRR, no MRG. No Carotid Bruits. No peripheral edema, warm, nontender Eyes: Conjunctivae clear without exudates or hemorrhage  Neuro: Detailed Neurologic Exam  Speech:  Speech is normal; fluent and spontaneous with normal comprehension.  Cognition:    The patient is oriented to person, place, and time;     recent and remote memory intact;     language fluent;     normal attention, concentration,     fund of knowledge Cranial Nerves:    The pupils are equal, round, and reactive to light. The fundi are normal and spontaneous venous pulsations are present. Visual fields are full to finger confrontation. Extraocular movements are intact. Trigeminal sensation is intact and the muscles of mastication are normal. The face is symmetric. The palate elevates in the midline. Hearing intact. Voice is normal. Shoulder shrug is normal. The tongue has normal motion without fasciculations.   Coordination:    Normal finger to nose and heel to shin. Normal rapid alternating movements.   Gait:    Heel-toe and tandem gait are normal.   Motor Observation:    No asymmetry, no atrophy, and no involuntary movements noted. Tone:    Normal muscle tone.    Posture:    Posture is normal. normal erect    Strength:    Strength is V/V in the upper and lower limbs.      Sensation: intact to LT     Reflex Exam:  DTR's:    Deep tendon reflexes in the upper and lower extremities are normal  bilaterally.   Toes:    The toes are downgoing bilaterally.   Clonus:    Clonus is absent.    Assessment/Plan:    No orders of the defined types were placed in this encounter.  No orders of the defined types were placed in this encounter.   Cc: Rising, Asberry RIGGERS,  Elnor Fairy HERO, NP  Onetha Epp, MD  [facility] [address] [place], [place] [address]  Phone [phone] Fax [phone]

## 2024-10-05 ENCOUNTER — Other Ambulatory Visit: Payer: Self-pay | Admitting: Internal Medicine

## 2024-10-06 NOTE — Telephone Encounter (Signed)
 Adderall  refilled

## 2024-10-06 NOTE — Telephone Encounter (Signed)
 Pt requesting refill of controlled medication - please advise, thank you!  LOV: 11/02/23 LF: 07/18/24

## 2024-11-06 ENCOUNTER — Other Ambulatory Visit: Payer: Self-pay | Admitting: Internal Medicine

## 2024-11-07 NOTE — Telephone Encounter (Signed)
 Adderall  refilled

## 2024-11-07 NOTE — Telephone Encounter (Signed)
 Pt requesting refill of controlled medication - please advise, thank you!   LOV: 11/02/23 LF: 10/06/24

## 2024-12-28 ENCOUNTER — Emergency Department (HOSPITAL_BASED_OUTPATIENT_CLINIC_OR_DEPARTMENT_OTHER)
Admission: EM | Admit: 2024-12-28 | Discharge: 2024-12-29 | Disposition: A | Discharge status: Home / Self Care | Attending: Emergency Medicine | Admitting: Emergency Medicine

## 2024-12-28 ENCOUNTER — Other Ambulatory Visit: Payer: Self-pay

## 2024-12-28 ENCOUNTER — Encounter (HOSPITAL_BASED_OUTPATIENT_CLINIC_OR_DEPARTMENT_OTHER): Payer: Self-pay | Admitting: Emergency Medicine

## 2024-12-28 DIAGNOSIS — Z79899 Other long term (current) drug therapy: Secondary | ICD-10-CM | POA: Diagnosis not present

## 2024-12-28 DIAGNOSIS — W19XXXA Unspecified fall, initial encounter: Secondary | ICD-10-CM | POA: Diagnosis not present

## 2024-12-28 DIAGNOSIS — E860 Dehydration: Secondary | ICD-10-CM | POA: Diagnosis not present

## 2024-12-28 DIAGNOSIS — R519 Headache, unspecified: Secondary | ICD-10-CM | POA: Diagnosis present

## 2024-12-28 LAB — CBC
HCT: 44.5 % (ref 36.0–46.0)
Hemoglobin: 15.4 g/dL — ABNORMAL HIGH (ref 12.0–15.0)
MCH: 28.3 pg (ref 26.0–34.0)
MCHC: 34.6 g/dL (ref 30.0–36.0)
MCV: 81.7 fL (ref 80.0–100.0)
Platelets: 375 K/uL (ref 150–400)
RBC: 5.45 MIL/uL — ABNORMAL HIGH (ref 3.87–5.11)
RDW: 12.9 % (ref 11.5–15.5)
WBC: 11 K/uL — ABNORMAL HIGH (ref 4.0–10.5)
nRBC: 0 % (ref 0.0–0.2)

## 2024-12-28 LAB — COMPREHENSIVE METABOLIC PANEL WITH GFR
ALT: 14 U/L (ref 0–44)
AST: 15 U/L (ref 15–41)
Albumin: 4.5 g/dL (ref 3.5–5.0)
Alkaline Phosphatase: 60 U/L (ref 38–126)
Anion gap: 12 (ref 5–15)
BUN: 7 mg/dL (ref 6–20)
CO2: 24 mmol/L (ref 22–32)
Calcium: 9.5 mg/dL (ref 8.9–10.3)
Chloride: 105 mmol/L (ref 98–111)
Creatinine, Ser: 0.68 mg/dL (ref 0.44–1.00)
GFR, Estimated: >60 mL/min
Glucose, Bld: 80 mg/dL (ref 70–99)
Potassium: 3.9 mmol/L (ref 3.5–5.1)
Sodium: 141 mmol/L (ref 135–145)
Total Bilirubin: 0.6 mg/dL (ref 0.0–1.2)
Total Protein: 7.3 g/dL (ref 6.5–8.1)

## 2024-12-28 MED ORDER — LACTATED RINGERS IV BOLUS
1000.0000 mL | Freq: Once | INTRAVENOUS | Status: AC
  Administered 2024-12-28: 1000 mL via INTRAVENOUS

## 2024-12-28 MED ORDER — ONDANSETRON HCL 4 MG/2ML IJ SOLN
4.0000 mg | Freq: Once | INTRAMUSCULAR | Status: AC
  Administered 2024-12-28: 4 mg via INTRAVENOUS
  Filled 2024-12-28: qty 2

## 2024-12-28 NOTE — ED Triage Notes (Signed)
 Pt via pov from home with headache and emesis following a fall on Monday. Pt has POTS and had a syncopal episode, which caused the fall. Pt had a liter of fluids last night and still has low blood pressure, according to her mother. Pt a&o x 4; nad noted.

## 2024-12-29 ENCOUNTER — Ambulatory Visit
Admission: RE | Admit: 2024-12-29 | Discharge: 2024-12-29 | Disposition: A | Discharge status: Home / Self Care | Attending: Family Medicine | Admitting: Family Medicine

## 2024-12-29 VITALS — BP 108/75 | HR 93 | Temp 98.9°F | Resp 18 | Ht 63.0 in | Wt 182.0 lb

## 2024-12-29 DIAGNOSIS — F419 Anxiety disorder, unspecified: Secondary | ICD-10-CM | POA: Diagnosis not present

## 2024-12-29 LAB — URINALYSIS, ROUTINE W REFLEX MICROSCOPIC
Glucose, UA: NEGATIVE mg/dL
Hgb urine dipstick: NEGATIVE
Ketones, ur: NEGATIVE mg/dL
Leukocytes,Ua: NEGATIVE
Nitrite: NEGATIVE
Protein, ur: NEGATIVE mg/dL
Specific Gravity, Urine: >1.03 — ABNORMAL HIGH (ref 1.005–1.030)
pH: 6 (ref 5.0–8.0)

## 2024-12-29 LAB — URINALYSIS, MICROSCOPIC (REFLEX): Bacteria, UA: NONE SEEN

## 2024-12-29 LAB — PREGNANCY, URINE: Preg Test, Ur: NEGATIVE

## 2024-12-29 MED ORDER — LACTATED RINGERS IV BOLUS
1000.0000 mL | Freq: Once | INTRAVENOUS | Status: AC
  Administered 2024-12-29: 1000 mL via INTRAVENOUS

## 2024-12-29 NOTE — Discharge Instructions (Addendum)
 Advised patient/mother please go to [facility] and [place], [place] for further evaluation.

## 2024-12-29 NOTE — ED Provider Notes (Signed)
 " Dawn Mcpherson    CSN: [id number] Arrival date & time: 12/29/24  1528      History   Chief Complaint Chief Complaint  Patient presents with   Anxiety    Severe anxiety, heart palpitations and racing heart rate. - Entered by patient    HPI Dawn Mcpherson is a 24 y.o. female.   HPI 24 year old female presents with severe anxiety, heart palpitations and heart racing.  Patient was evaluated [facility] earlier today please see epic for that encounter note.  Mother reports that PCP advised her to come to urgent [facility] and has increased her metoprolol .  PMH significant for atrial fibrillation transient, POTS, and anxiety.  Patient is accompanied by her mother today.  Patient denies thoughts, plans, or intentions of harming herself or others.  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   POTS (postural orthostatic tachycardia syndrome) 11/02/2023   Healthcare maintenance 11/02/2023   Generalized body aches 08/08/2022   Annual physical exam 07/29/2022   Missed period 07/29/2022   Amenorrhea 07/29/2022   School health examination 07/29/2022   Sleep disorder 05/22/2022   URI (upper respiratory infection) 12/19/2021   PSVT (paroxysmal supraventricular tachycardia) 12/02/2021   Chest pain 12/02/2021   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   Right lower quadrant abdominal pain 09/23/2021   Nausea and vomiting 09/23/2021   Left foot pain 06/18/2021   Acute serous otitis media of left ear without rupture 05/03/2021   Constipation 05/03/2021   Encounter to establish [facility]son 03/06/2021   ADHD  03/06/2021   Weight gain, abnormal 03/06/2021   Hidradenitis suppurativa 03/06/2021   MDD (major depressive disorder), single episode, severe , no psychosis (HCC) 09/20/2014   Generalized anxiety disorder 09/20/2014    Past Surgical History:  Procedure Laterality Date   COLONOSCOPY WITH ESOPHAGOGASTRODUODENOSCOPY (EGD)  07/2020   [facility], Dr. Billy   INCISION AND DRAINAGE      OB History     Gravida  0   Para  0   Term  0   Preterm  0   AB  0   Living  0      SAB  0   IAB  0   Ectopic  0   Multiple  0   Live Births  0            Home Medications    Prior to Admission medications  Medication Sig Start Date End Date Taking? Authorizing Provider  amphetamine -dextroamphetamine  (ADDERALL) 10 MG tablet TAKE 1 TABLET BY MOUTH TWICE DAILY AS DIRECTED 05/30/24  Yes Young, Clinton D, MD  amphetamine -dextroamphetamine  (ADDERALL) 10 MG tablet TAKE 1 TABLET BY MOUTH TWICE DAILY AS DIRECTED 07/18/24  Yes Young, Clinton D, MD  amphetamine -dextroamphetamine  (ADDERALL) 10 MG tablet TAKE 1 TABLET BY MOUTH TWICE DAILY AS DIRECTED 10/06/24  Yes Young, Clinton D, MD  amphetamine -dextroamphetamine  (ADDERALL) 10 MG tablet TAKE 1 TABLET BY MOUTH TWICE DAILY AS DIRECTED 11/07/24  Yes Young, Clinton D, MD  hydrOXYzine  (VISTARIL ) 25 MG capsule TAKE 1 CAPSULE EVERY 8 HOURS AS NEEDED FOR  ANXIETY 03/19/23  Yes Melvenia Manus BRAVO, MD  metoprolol  tartrate (LOPRESSOR ) 25 MG tablet TAKE 1/2 TABLET BY MOUTH TWICE DAILY 04/14/22  Yes Branch, Ronal BRAVO, MD  omeprazole  (PRILOSEC) 20 MG capsule Take 1 capsule (20 mg total) by mouth daily. 09/23/21  Yes Carlan, Chelsea L, NP  ondansetron  (ZOFRAN ) 4 MG tablet Take 1 tablet (4 mg total) by mouth every 8 (eight) hours as needed for nausea or vomiting. 09/23/21  Yes Carlan, Chelsea L, NP  ondansetron  (ZOFRAN -ODT) 4 MG disintegrating tablet Take 1 tablet (4 mg total) by mouth every 8 (eight) hours as needed for nausea or vomiting. 01/29/24  Yes Schutt, Marsa HERO, PA-C  sertraline (ZOLOFT) 50 MG tablet Take 50 mg by mouth daily. 10/30/21  Yes [provider]  SUMAtriptan  (IMITREX ) 100 MG tablet Take 1 tablet by mouth for moderate-severe headache. May repeat in 2 hours if headache persists or recurs. 02/24/24  Yes Rising, Asberry, PA-C  amphetamine -dextroamphetamine  (ADDERALL) 10 MG tablet TAKE ONE TABLET BY MOUTH TWICE A DAY AS DIRECTED 07/02/23   Neysa Rama D, MD  amphetamine -dextroamphetamine  (ADDERALL) 10 MG tablet TAKE ONE TABLET BY MOUTH TWICE A DAY AS DIRECTED 11/16/23   Cobb, Katherine V, NP  furosemide (LASIX) 20 MG tablet Take 20 mg by mouth daily. Patient not taking: Reported on 11/02/2023 05/12/22   [provider]    Family History Family History  Problem Relation Age of Onset   Hypertension Mother    Depression Mother    Diabetes Father    Healthy Sister    Healthy Sister    Healthy Sister    Multiple sclerosis Maternal Grandmother    Diabetes Maternal Grandfather    Thyroid  disease Maternal Grandfather    Breast cancer Paternal Grandmother    Bladder Cancer Paternal Grandfather    Diabetes Paternal Grandfather    Leukemia Paternal Grandfather     Social History Social History[1]   Allergies   Ketorolac  tromethamine , Gluten meal, and Reglan [metoclopramide]   Review of Systems Review of Systems  Psychiatric/Behavioral:  The patient is nervous/anxious.      Physical Exam Triage Vital Signs ED Triage Vitals  Encounter Vitals Group     BP      Girls Systolic BP Percentile      Girls Diastolic BP Percentile      Boys Systolic BP Percentile      Boys Diastolic BP Percentile      Pulse      Resp      Temp      Temp src      SpO2      Weight      Height      Head Circumference      Peak Flow      Pain Score      Pain Loc      Pain Education      Exclude from Growth Chart    No data found.  Updated Vital Signs BP 108/75 (BP Location: Right Arm)   Pulse 93   Temp 98.9 F (37.2 C)  (Oral)   Resp 18   Ht 5' 3 (1.6 m)   Wt 182 lb (82.6 kg)   LMP 12/08/2024 (Approximate)   SpO2 96%   BMI 32.24 kg/m    Physical Exam Vitals and nursing note reviewed.  Constitutional:      General: She is not in acute distress.    Appearance: Normal appearance. She is obese. She is not ill-appearing, toxic-appearing  or diaphoretic.  HENT:     Head: Normocephalic and atraumatic.     Mouth/Throat:     Mouth: Mucous membranes are moist.     Pharynx: Oropharynx is clear.  Eyes:     Extraocular Movements: Extraocular movements intact.     Conjunctiva/sclera: Conjunctivae normal.     Pupils: Pupils are equal, round, and reactive to light.  Cardiovascular:     Rate and Rhythm: Normal rate and regular rhythm.     Heart sounds: Normal heart sounds. No murmur heard.    No friction rub. No gallop.  Pulmonary:     Effort: Pulmonary effort is normal.     Breath sounds: Normal breath sounds. No wheezing, rhonchi or rales.  Musculoskeletal:        General: Normal range of motion.  Skin:    General: Skin is warm and dry.  Neurological:     General: No focal deficit present.     Mental Status: She is alert and oriented to person, place, and time. Mental status is at baseline.  Psychiatric:        Mood and Affect: Mood normal.        Behavior: Behavior normal.        Thought Content: Thought content normal.      UC Treatments / Results  Labs (all labs ordered are listed, but only abnormal results are displayed) Labs Reviewed - No data to display  EKG   Radiology No results found.  Procedures Procedures (including critical Mcpherson time)  Medications Ordered in UC Medications - No data to display  Initial Impression / Assessment and Plan / UC Course  I have reviewed the triage vital signs and the nursing notes.  Pertinent labs & imaging results that were available during my [facility]son of the patient were reviewed by me and considered in my medical decision making (see chart for  details).     MDM: 1.  Anxiety-discussion with mother and patient regarding medication for anxiety attack.  Patient reports that Vistaril  is providing no relief.  She declined new prescription of this medication.  Patient denies thoughts, plans, or intentions of harming herself or others.  Advised patient/mother please go to [facility] and [place], [place] for further evaluation.  Mother/patient agreed and verbalized understanding of these instructions and this plan of [facility] today. Final Clinical Impressions(s) / UC Diagnoses   Final diagnoses:  Anxiety     Discharge Instructions      Advised patient/[facility] and [place], [place] for further evaluation.     ED Prescriptions   None    PDMP not reviewed this encounter.    [1]  Social History Tobacco Use   Smoking status: Never    Passive exposure: Yes   Smokeless tobacco: Never  Vaping Use   Vaping status: Never Used  Substance Use Topics   Alcohol use: No   Drug use: No     Teddy Sharper, FNP 12/29/24 1616  "

## 2024-12-29 NOTE — ED Triage Notes (Signed)
 Patient's mother states that patient has POTS, Monday she did pass out at work.  Tuesday was administered IV fluids & Zofran .  Patient felt better after receiving fluids.  On Wednesday started feeling bad again went to [facility], BP was low 80's over 50's, severely hydrated, given 2 bags of Lactate Ringers  and IV Zofran .  BP still on the lower end.  When she got home started having racing HR and chest pains, history of cardiac problems.  PCP advised patient to follow up with UC and he increased her Metoprolol .

## 2024-12-29 NOTE — ED Provider Notes (Signed)
 " [place] EMERGENCY DEPARTMENT AT [facility] Provider Note   CSN: [id number] Arrival date & time: 12/28/24  1949     Patient presents with: Fall and Migraine   Dawn Mcpherson is a 24 y.o. female.   24 year old female presents the ER today secondary to POTS with a history of headaches with this POTS.  She states that she has had a flare recently and a couple days ago she did fall and might of hit her head.  She has had a headache and low blood pressure since that time.  Her mother administered IV Zofran  and IV fluids last night but her blood pressures remained lower than her baseline.  She feels like this is a recurrence of her POTS type symptoms.   Fall  Migraine       Prior to Admission medications  Medication Sig Start Date End Date Taking? Authorizing Provider  amphetamine -dextroamphetamine  (ADDERALL) 10 MG tablet TAKE ONE TABLET BY MOUTH TWICE A DAY AS DIRECTED 07/02/23   Neysa Rama D, MD  amphetamine -dextroamphetamine  (ADDERALL) 10 MG tablet TAKE ONE TABLET BY MOUTH TWICE A DAY AS DIRECTED 11/16/23   Cobb, Comer GAILS, NP  amphetamine -dextroamphetamine  (ADDERALL) 10 MG tablet TAKE 1 TABLET BY MOUTH TWICE DAILY AS DIRECTED 05/30/24   Neysa Rama D, MD  amphetamine -dextroamphetamine  (ADDERALL) 10 MG tablet TAKE 1 TABLET BY MOUTH TWICE DAILY AS DIRECTED 07/18/24   Neysa Rama D, MD  amphetamine -dextroamphetamine  (ADDERALL) 10 MG tablet TAKE 1 TABLET BY MOUTH TWICE DAILY AS DIRECTED 10/06/24   Neysa Rama D, MD  amphetamine -dextroamphetamine  (ADDERALL) 10 MG tablet TAKE 1 TABLET BY MOUTH TWICE DAILY AS DIRECTED 11/07/24   Neysa Rama D, MD  furosemide (LASIX) 20 MG tablet Take 20 mg by mouth daily. Patient not taking: Reported on 11/02/2023 05/12/22   [provider]  hydrOXYzine  (VISTARIL ) 25 MG capsule TAKE 1 CAPSULE EVERY 8 HOURS AS NEEDED FOR ANXIETY 03/19/23   Dixon, Phillip E, MD  metoprolol  tartrate (LOPRESSOR ) 25 MG tablet TAKE 1/2 TABLET BY  MOUTH TWICE DAILY 04/14/22   Alvan Ronal BRAVO, MD  omeprazole  (PRILOSEC) 20 MG capsule Take 1 capsule (20 mg total) by mouth daily. 09/23/21   Carlan, Chelsea L, NP  ondansetron  (ZOFRAN ) 4 MG tablet Take 1 tablet (4 mg total) by mouth every 8 (eight) hours as needed for nausea or vomiting. 09/23/21   Carlan, Chelsea L, NP  ondansetron  (ZOFRAN -ODT) 4 MG disintegrating tablet Take 1 tablet (4 mg total) by mouth every 8 (eight) hours as needed for nausea or vomiting. 01/29/24   Schutt, Marsa HERO, PA-C  sertraline (ZOLOFT) 50 MG tablet Take 50 mg by mouth daily. 10/30/21   [provider]  SUMAtriptan  (IMITREX ) 100 MG tablet Take 1 tablet by mouth for moderate-severe headache. May repeat in 2 hours if headache persists or recurs. 02/24/24   Rising, Asberry, PA-C    Allergies: Ketorolac  tromethamine , Gluten meal, and Reglan [metoclopramide]    Review of Systems  Updated Vital Signs BP 97/62   Pulse 72   Temp 98.2 F (36.8 C) (Oral)   Resp 19   Ht 5' 3 (1.6 m)   Wt 97.5 kg   LMP 12/07/2024 (Approximate)   SpO2 100%   BMI 38.08 kg/m   Physical Exam Vitals and nursing note reviewed.  Constitutional:      Appearance: She is well-developed.  HENT:     Head: Normocephalic and atraumatic.  Cardiovascular:     Rate and Rhythm: Normal rate and regular rhythm.  Pulmonary:     Effort: No respiratory distress.     Breath sounds: No stridor.  Abdominal:     General: There is no distension.  Musculoskeletal:     Cervical back: Normal range of motion.  Skin:    General: Skin is warm and dry.  Neurological:     General: No focal deficit present.     Mental Status: She is alert.     (all labs ordered are listed, but only abnormal results are displayed) Labs Reviewed  CBC - Abnormal; Notable for the following components:      Result Value   WBC 11.0 (*)    RBC 5.45 (*)    Hemoglobin 15.4 (*)    All other components within normal limits  URINALYSIS, ROUTINE W REFLEX MICROSCOPIC -  Abnormal; Notable for the following components:   Color, Urine STRAW (*)    Specific Gravity, Urine >1.030 (*)    Bilirubin Urine SMALL (*)    All other components within normal limits  COMPREHENSIVE METABOLIC PANEL WITH GFR  PREGNANCY, URINE  URINALYSIS, MICROSCOPIC (REFLEX)  CBG MONITORING, ED    EKG: EKG Interpretation Date/Time:  Wednesday December 28 2024 23:24:15 EST Ventricular Rate:  82 PR Interval:  148 QRS Duration:  93 QT Interval:  419 QTC Calculation: 490 R Axis:   84  Text Interpretation: Sinus rhythm Low voltage, precordial leads Borderline T abnormalities, anterior leads Borderline prolonged QT interval Confirmed by Lorette Mayo [phone]) on 12/28/2024 11:41:45 PM  Radiology: No results found.   Procedures   Medications Ordered in the ED  lactated ringers  bolus 1,000 mL (0 mLs Intravenous Stopped 12/29/24 0033)  ondansetron  (ZOFRAN ) injection 4 mg (4 mg Intravenous Given 12/28/24 2351)  lactated ringers  bolus 1,000 mL (0 mLs Intravenous Stopped 12/29/24 0213)                                    Medical Decision Making Amount and/or Complexity of Data Reviewed Labs: ordered. ECG/medicine tests: ordered.  Risk Prescription drug management.   Low suspicion for any intracranial injury as it happened a couple days ago and she has no external signs of significant head trauma and her symptoms can be explained by her POTS.  Blood pressure was initially low but soft.  Improved quite a bit with fluids and she was able to ambulate to the bathroom without difficulty.  No longer have any syncope, fogginess or any other associated symptoms.  At this point patient appears to be stable for discharge with outpatient follow-up.  No indication for further workup or hospitalization at this time.   Final diagnoses:  Dehydration    ED Discharge Orders     None          Calib Wadhwa, Mayo, MD 12/29/24 [phone]  "
